# Patient Record
Sex: Male | Born: 1960
Health system: Southern US, Community
[De-identification: ages and names within clinical notes are randomized; demographics above are authoritative.]

## PROBLEM LIST (undated history)

## (undated) DIAGNOSIS — Z973 Presence of spectacles and contact lenses: Secondary | ICD-10-CM

## (undated) DIAGNOSIS — K429 Umbilical hernia without obstruction or gangrene: Secondary | ICD-10-CM

## (undated) DIAGNOSIS — K4020 Bilateral inguinal hernia, without obstruction or gangrene, not specified as recurrent: Secondary | ICD-10-CM

## (undated) DIAGNOSIS — E785 Hyperlipidemia, unspecified: Secondary | ICD-10-CM

## (undated) DIAGNOSIS — B351 Tinea unguium: Secondary | ICD-10-CM

## (undated) HISTORY — DX: Hyperlipidemia, unspecified: E78.5

## (undated) HISTORY — PX: WISDOM TOOTH EXTRACTION: SHX21

---
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
## 2016-08-27 HISTORY — PX: COLONOSCOPY: SHX5424

## 2016-09-21 LAB — HM COLONOSCOPY

## 2017-10-29 DIAGNOSIS — J209 Acute bronchitis, unspecified: Secondary | ICD-10-CM | POA: Diagnosis not present

## 2018-12-29 ENCOUNTER — Ambulatory Visit (INDEPENDENT_AMBULATORY_CARE_PROVIDER_SITE_OTHER): Payer: Federal, State, Local not specified - PPO | Admitting: Osteopathic Medicine

## 2018-12-29 ENCOUNTER — Encounter: Payer: Self-pay | Admitting: Osteopathic Medicine

## 2018-12-29 VITALS — BP 108/72 | HR 79 | Temp 98.2°F | Ht 68.0 in | Wt 174.7 lb

## 2018-12-29 DIAGNOSIS — Z0000 Encounter for general adult medical examination without abnormal findings: Secondary | ICD-10-CM | POA: Diagnosis not present

## 2018-12-29 DIAGNOSIS — E782 Mixed hyperlipidemia: Secondary | ICD-10-CM

## 2018-12-29 DIAGNOSIS — B351 Tinea unguium: Secondary | ICD-10-CM | POA: Diagnosis not present

## 2018-12-29 HISTORY — DX: Tinea unguium: B35.1

## 2018-12-29 HISTORY — DX: Mixed hyperlipidemia: E78.2

## 2018-12-29 MED ORDER — CICLOPIROX 8 % EX SOLN: Freq: Every day | CUTANEOUS | 11 refills | Status: DC

## 2018-12-29 NOTE — Progress Notes (Signed)
HPI: Benjamin Hall is a 58 y.o. male who  has a past medical history of High cholesterol, Mixed hyperlipidemia (12/29/2018), and Onychomycosis (12/29/2018).  he presents to [facility] today, 12/29/18,  for chief complaint of: New to establish care   Very pleasant new patient here to establish care.  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   History of high cholesterol, taking simvastatin for many years. Onychomycosis: Toenails are looking pretty bad.    Past medical, surgical, social and family history reviewed:  Patient Active Problem List   Diagnosis Date Noted  . Onychomycosis 12/29/2018  . Mixed hyperlipidemia 12/29/2018    Past Surgical History:  Procedure Laterality Date  . HERNIA REPAIR  1964    Social History   Tobacco Use  . Smoking status: Never Smoker  . Smokeless tobacco: Never Used  Substance Use Topics  . Alcohol use: Yes    Alcohol/week: 7.0 standard drinks    Types: 7 Standard drinks or equivalent per week    Family History  Problem Relation Age of Onset  . Lung cancer Mother   . Heart attack Father      Current medication list and allergy/intolerance information reviewed:    Current Outpatient Medications  Medication Sig Dispense Refill  . Aspirin Buf,CaCarb-MgCarb-MgO, 81 MG TABS Take by mouth.    . simvastatin (ZOCOR) 20 MG tablet Take 20 mg by mouth daily.    . ciclopirox (PENLAC) 8 % solution Apply topically at bedtime. Apply over nail and surrounding skin. Apply daily over previous coat. After seven (7) days, may remove with alcohol and continue cycle. 12 mL 11   No current facility-administered medications for this visit.     Allergies  Allergen Reactions  . Penicillins Nausea And Vomiting    Patient states that as a child penicillin gave him an upset stomach      Review of Systems:  Constitutional:  No  fever, no  chills, No recent illness, No unintentional weight changes. No significant fatigue.   HEENT: No  headache, no vision change, no hearing change, No sore throat, No  sinus pressure  Cardiac: No  chest pain, No  pressure, No palpitations, No  Orthopnea  Respiratory:  No  shortness of breath. No  Cough  Gastrointestinal: No  abdominal pain, No  nausea, No  vomiting,  No  blood in stool, No  diarrhea, No  constipation   Musculoskeletal: No new myalgia/arthralgia  Skin: No  Rash, No other wounds/concerning lesions  Genitourinary: No  incontinence, No  abnormal genital bleeding, No abnormal genital discharge  Hem/Onc: No  easy bruising/bleeding, No  abnormal lymph node  Endocrine: No cold intolerance,  No heat intolerance. No polyuria/polydipsia/polyphagia   Neurologic: No  weakness, No  dizziness, No  slurred speech/focal weakness/facial droop  Psychiatric: No  concerns with depression, No  concerns with anxiety, No sleep problems, No mood problems  Exam:  BP 108/72 (BP Location: Left Arm, Patient Position: Sitting, Cuff Size: Normal)   Pulse 79   Temp 98.2 F (36.8 C) (Oral)   Ht 5\' 8"  (1.727 m)   Wt 174 lb 11.2 oz (79.2 kg)   BMI 26.56 kg/m   Constitutional: VS see above. General Appearance: alert, well-developed, well-nourished, NAD  Eyes: Normal lids and conjunctive, non-icteric sclera  Ears, Nose, Mouth, Throat: MMM, Normal external inspection ears/nares/mouth/lips/gums. TM normal bilaterally. Pharynx/tonsils no erythema, no  exudate. Nasal mucosa normal.   Neck: No masses, trachea midline. No thyroid enlargement. No tenderness/mass appreciated. No lymphadenopathy  Respiratory: Normal respiratory effort. no wheeze, no rhonchi, no rales  Cardiovascular: S1/S2 normal, no murmur, no rub/gallop auscultated. RRR. No lower extremity edema  Gastrointestinal: Nontender, no masses. No hepatomegaly, no splenomegaly. No hernia appreciated. Bowel sounds normal. Rectal exam deferred.    Musculoskeletal: Gait normal. No clubbing/cyanosis of digits.   Neurological: Normal balance/coordination. No tremor.  Skin: warm, dry, intact. No rash/ulcer. No concerning nevi or subq nodules on limited exam.    Psychiatric: Normal judgment/insight. Normal mood and affect. Oriented x3.     ASSESSMENT/PLAN: The primary encounter diagnosis was Annual physical exam. Diagnoses of Mixed hyperlipidemia and Onychomycosis were also pertinent to this visit.   Orders Placed This Encounter  Procedures  . CBC  . COMPLETE METABOLIC PANEL WITH GFR  . Lipid panel  . TSH  . Hemoglobin A1c  . Urinalysis, Routine w reflex microscopic  . PSA, Total with Reflex to PSA, Free    Meds ordered this encounter  Medications  . ciclopirox (PENLAC) 8 % solution    Sig: Apply topically at bedtime. Apply over nail and surrounding skin. Apply daily over previous coat. After seven (7) days, may remove with alcohol and continue cycle.    Dispense:  12 mL    Refill:  11    Patient Instructions  General Preventive Care  Most recent routine screening lipids/other labs: ordered today   Everyone should have blood pressure checked once per year.   Tobacco: don't!   Alcohol: responsible moderation is ok for most adults - if you have concerns about your alcohol intake, please talk to me!   Exercise: as tolerated to reduce risk of cardiovascular disease and diabetes. Strength training will also prevent osteoporosis.   Mental health: if need for mental health care (medicines, counseling, other), or concerns about moods, please let me know!   Sexual health: if need for STD testing, or if concerns with libido/pain problems, please let me know!  Advanced Directive: Living Will and/or Healthcare Power of Attorney recommended for all adults, regardless of age or health.  Vaccines  Flu vaccine: recommended for almost everyone, every fall.   Shingles vaccine: Shingrix recommended after age 43.  Pneumonia  vaccines: Prevnar and Pneumovax recommended after age 71, or sooner if certain medical conditions.  Tetanus booster: Tdap recommended every 10 years.  Cancer screenings   Colon cancer screening: due in 10 years after your last one   Prostate cancer screening: PSA blood test around age 39  Lung cancer screening: not neded for non-smokers  Infection screenings . HIV: recommended screening at least once age 66-65, more often as needed. . Gonorrhea/Chlamydia: screening as needed. . Hepatitis C: recommended for anyone born [birth year]-1965 . TB: certain at-risk populations, or depending on work requirements and/or travel history Other . Bone Density Test: recommended for men at age 16, sooner depending on risk factors        Visit summary with medication list and pertinent instructions was printed for patient to review. All questions at time of visit were answered - patient instructed to contact office with any additional concerns or updates. ER/RTC precautions were reviewed with the patient.   Please note: voice recognition software was used to produce this document, and typos may escape review. Please contact Dr. Sheppard Coil for any needed clarifications.    Follow-up plan: Return in about 1 year (around 12/29/2019) for annual physical, sooner if needed /  dependign on lab results .

## 2018-12-29 NOTE — Patient Instructions (Addendum)
General Preventive Care  Most recent routine screening lipids/other labs: ordered today   Everyone should have blood pressure checked once per year.   Tobacco: don't!   Alcohol: responsible moderation is ok for most adults - if you have concerns about your alcohol intake, please talk to me!   Exercise: as tolerated to reduce risk of cardiovascular disease and diabetes. Strength training will also prevent osteoporosis.   Mental health: if need for mental health care (medicines, counseling, other), or concerns about moods, please let me know!   Sexual health: if need for STD testing, or if concerns with libido/pain problems, please let me know!  Advanced Directive: Living Will and/or Healthcare Power of Attorney recommended for all adults, regardless of age or health.  Vaccines  Flu vaccine: recommended for almost everyone, every fall.   Shingles vaccine: Shingrix recommended after age 39.  Pneumonia vaccines: Prevnar and Pneumovax recommended after age 28, or sooner if certain medical conditions.  Tetanus booster: Tdap recommended every 10 years.  Cancer screenings   Colon cancer screening: due in 10 years after your last one   Prostate cancer screening: PSA blood test around age 5  Lung cancer screening: not neded for non-smokers  Infection screenings . HIV: recommended screening at least once age 52-65, more often as needed. . Gonorrhea/Chlamydia: screening as needed. . Hepatitis C: recommended for anyone born 88-1964 . TB: certain at-risk populations, or depending on work requirements and/or travel history Other . Bone Density Test: recommended for men at age 36, sooner depending on risk factors

## 2019-03-08 ENCOUNTER — Telehealth: Payer: Self-pay

## 2019-03-08 ENCOUNTER — Telehealth: Payer: Self-pay | Admitting: Osteopathic Medicine

## 2019-03-08 MED ORDER — SIMVASTATIN 20 MG PO TABS: 20.0000 mg | ORAL_TABLET | Freq: Every day | ORAL | 3 refills | Status: DC

## 2019-03-08 NOTE — Telephone Encounter (Signed)
Pt stopped by the [facility] requesting med refill for simvastatin. Rx written by historical provider. Pls [facility] [address]. Thanks.

## 2019-03-08 NOTE — Telephone Encounter (Signed)
Patient stopped in after having bloodwork and stated that he needed a refill on simvastatin (20mg  tablet). [facility] [address] [facility]. His cell number on file is correct. Patient also inquired about getting his first shingles vaccine.

## 2019-03-08 NOTE — Telephone Encounter (Signed)
Thank you for the update. Rx was written by another provider. Separate telephone encounter for med refill request sent to provider for review.

## 2019-03-09 LAB — LIPID PANEL
Cholesterol: 191 mg/dL (ref <200)
HDL: 73 mg/dL (ref >=40)
LDL Cholesterol (Calc): 99 mg/dL (calc)
Non-HDL Cholesterol (Calc): 118 mg/dL (calc) (ref <130)
Total CHOL/HDL Ratio: 2.6 (calc) (ref <5.0)
Triglycerides: 93 mg/dL (ref <150)

## 2019-03-09 LAB — COMPLETE METABOLIC PANEL WITH GFR
AG Ratio: 1.5 (calc) (ref 1.0–2.5)
ALT: 22 U/L (ref 9–46)
AST: 21 U/L (ref 10–35)
Albumin: 4.3 g/dL (ref 3.6–5.1)
Alkaline phosphatase (APISO): 47 U/L (ref 35–144)
BUN: 15 mg/dL (ref 7–25)
CO2: 28 mmol/L (ref 20–32)
Calcium: 9.7 mg/dL (ref 8.6–10.3)
Chloride: 100 mmol/L (ref 98–110)
Creat: 0.96 mg/dL (ref 0.70–1.33)
GFR, Est African American: 101 mL/min/{1.73_m2} (ref >=60)
GFR, Est Non African American: 87 mL/min/{1.73_m2} (ref >=60)
Globulin: 2.9 g/dL (calc) (ref 1.9–3.7)
Glucose, Bld: 110 mg/dL — ABNORMAL HIGH (ref 65–99)
Potassium: 4.8 mmol/L (ref 3.5–5.3)
Sodium: 137 mmol/L (ref 135–146)
Total Bilirubin: 0.9 mg/dL (ref 0.2–1.2)
Total Protein: 7.2 g/dL (ref 6.1–8.1)

## 2019-03-09 LAB — CBC
HCT: 49 % (ref 38.5–50.0)
Hemoglobin: 16.6 g/dL (ref 13.2–17.1)
MCH: 30.9 pg (ref 27.0–33.0)
MCHC: 33.9 g/dL (ref 32.0–36.0)
MCV: 91.2 fL (ref 80.0–100.0)
MPV: 8.9 fL (ref 7.5–12.5)
Platelets: 213 10*3/uL (ref 140–400)
RBC: 5.37 10*6/uL (ref 4.20–5.80)
RDW: 13.1 % (ref 11.0–15.0)
WBC: 4.9 10*3/uL (ref 3.8–10.8)

## 2019-03-09 LAB — URINALYSIS, ROUTINE W REFLEX MICROSCOPIC
Bilirubin Urine: NEGATIVE
Glucose, UA: NEGATIVE
Hgb urine dipstick: NEGATIVE
Ketones, ur: NEGATIVE
Leukocytes,Ua: NEGATIVE
Nitrite: NEGATIVE
Protein, ur: NEGATIVE
Specific Gravity, Urine: 1.004 (ref 1.001–1.03)
pH: 7.5 (ref 5.0–8.0)

## 2019-03-09 LAB — HEMOGLOBIN A1C
Hgb A1c MFr Bld: 5.3 % of total Hgb (ref <5.7)
Mean Plasma Glucose: 105 (calc)
eAG (mmol/L): 5.8 (calc)

## 2019-03-09 LAB — TSH: TSH: 2.91 mIU/L (ref 0.40–4.50)

## 2019-03-10 LAB — REFLEX PSA, FREE
PSA, % Free: 9 % (calc) — ABNORMAL LOW (ref >25)
PSA, Free: 0.7 ng/mL

## 2019-03-10 LAB — PSA, TOTAL WITH REFLEX TO PSA, FREE: PSA, Total: 7.9 ng/mL — ABNORMAL HIGH (ref <=4.0)

## 2019-03-15 ENCOUNTER — Other Ambulatory Visit: Payer: Self-pay | Admitting: Osteopathic Medicine

## 2019-03-15 DIAGNOSIS — R972 Elevated prostate specific antigen [PSA]: Secondary | ICD-10-CM

## 2019-03-15 NOTE — Progress Notes (Signed)
Called patient Benjamin Hall 6:08 PM   Informed of elevated PSA test and next step would be a referral to urology given that it is over 7.0 He got blood drawn last Wednesday, I was out of the office from Thursday until today -have addressed with staff that leaving results on reviewed for this long is not acceptable.  Will ask a rush be put on this referral.  To triage:  Patient also had a question about insurance coverage for shingles shot, he states that he ask 1 of the ladies who worked at the front desk and they said that they would get back to him on this but he has not heard anything, I do not see any documentation of this.  I advised him that to be absolutely sure, he should probably call his insurance but if we can do this favor for him and will advise front desk staff to appropriately route such questions in the future.

## 2019-03-21 DIAGNOSIS — R972 Elevated prostate specific antigen [PSA]: Secondary | ICD-10-CM | POA: Diagnosis not present

## 2019-04-04 ENCOUNTER — Encounter: Payer: Self-pay | Admitting: Osteopathic Medicine

## 2019-04-04 DIAGNOSIS — R972 Elevated prostate specific antigen [PSA]: Secondary | ICD-10-CM | POA: Insufficient documentation

## 2019-04-05 DIAGNOSIS — R972 Elevated prostate specific antigen [PSA]: Secondary | ICD-10-CM | POA: Diagnosis not present

## 2019-04-13 DIAGNOSIS — R972 Elevated prostate specific antigen [PSA]: Secondary | ICD-10-CM | POA: Diagnosis not present

## 2019-04-13 DIAGNOSIS — K4090 Unilateral inguinal hernia, without obstruction or gangrene, not specified as recurrent: Secondary | ICD-10-CM | POA: Diagnosis not present

## 2019-05-01 ENCOUNTER — Ambulatory Visit: Payer: Self-pay | Admitting: Surgery

## 2019-05-01 DIAGNOSIS — K429 Umbilical hernia without obstruction or gangrene: Secondary | ICD-10-CM | POA: Diagnosis not present

## 2019-05-01 DIAGNOSIS — K4021 Bilateral inguinal hernia, without obstruction or gangrene, recurrent: Secondary | ICD-10-CM | POA: Diagnosis not present

## 2019-05-01 DIAGNOSIS — M6208 Separation of muscle (nontraumatic), other site: Secondary | ICD-10-CM | POA: Diagnosis not present

## 2019-05-01 NOTE — H&P (Signed)
Benjamin Hall Documented: 05/01/2019 9:22 AM Location: [facility] Patient #: [id number] DOB: 1961/01/24 Married / Language: Cleophus Molt / Race: White Male  History of Present Illness Benjamin Hector MD; 05/01/2019 10:09 AM) The patient is a 58 year old male who presents with an inguinal hernia. Note for "Inguinal hernia": ` ` ` Patient sent for surgical consultation at the request of Dr Karsten Ro, [facility]  Chief Complaint: Right inguinal hernia ` ` The patient is a pleasant gentleman. Was found to have an elevated PSA. Saw urology with workup and biopsies. Fortunately, no evidence of prostate cancer. Patient also noted he had a right inguinal hernia. Because prostate workup was benign, surgical consultation made to discuss possible surgical repair of his inguinal hernia. h/o pediatric inguinal hernia repairs as infant. Occasional RIH swelling/pain. Also other side. He comes today by himself. He is otherwise rather active. He does not smoke. He is not diabetic. He can walk several miles without difficulty and actually runs. Usually moves his bowels most days. He recalls having a normal colonoscopy a few years ago when he lived down in [place]. No problems with urination or defecation.  No personal nor family history of GI/colon cancer, inflammatory bowel disease, irritable bowel syndrome, allergy such as Celiac Sprue, dietary/dairy problems, colitis, ulcers nor gastritis. No recent sick contacts/gastroenteritis. No travel outside the country. No changes in diet. No dysphagia to solids or liquids. No significant heartburn or reflux. No hematochezia, hematemesis, coffee ground emesis. No evidence of prior gastric/peptic ulceration.  (Review of systems as stated in this history (HPI) or in the review of systems. Otherwise all other 12 point ROS are negative) ` ` `   Past Surgical History Nance Pew, CMA; 05/01/2019 9:23 AM) Oral  Surgery  Diagnostic Studies History ([place], CMA; 05/01/2019 9:23 AM) Colonoscopy 1-5 years ago  Allergies ([place], CMA; 05/01/2019 9:23 AM) Penicillins Allergies Reconciled  Medication History Nance Pew, CMA; 05/01/2019 9:24 AM) Simvastatin (20MG  Tablet, Oral) Active. Aspirin (81MG  Tablet, 1 (one) Oral) Active. Medications Reconciled  Social History Nance Pew, CMA; 05/01/2019 9:23 AM) Alcohol use Occasional alcohol use. Caffeine use Coffee. No drug use Tobacco use Never smoker.  Family History Nance Pew, [place]; 05/01/2019 9:23 AM) Arthritis Mother. Heart Disease Father, Mother. Respiratory Condition Mother.  Other Problems Nance Pew, CMA; 05/01/2019 9:23 AM) Hemorrhoids Hypercholesterolemia     Review of Systems ([place]; 05/01/2019 9:23 AM) General Not Present- Appetite Loss, Chills, Fatigue, Fever, Night Sweats, Weight Gain and Weight Loss. Skin Not Present- Change in Wart/Mole, Dryness, Hives, Jaundice, New Lesions, Non-Healing Wounds, Rash and Ulcer. HEENT Present- Wears glasses/contact lenses. Not Present- Earache, Hearing Loss, Hoarseness, Nose Bleed, Oral Ulcers, Ringing in the Ears, Seasonal Allergies, Sinus Pain, Sore Throat, Visual Disturbances and Yellow Eyes. Respiratory Present- Snoring. Not Present- Bloody sputum, Chronic Cough, Difficulty Breathing and Wheezing. Breast Not Present- Breast Mass, Breast Pain, Nipple Discharge and Skin Changes. Cardiovascular Not Present- Chest Pain, Difficulty Breathing Lying Down, Leg Cramps, Palpitations, Rapid Heart Rate, Shortness of Breath and Swelling of Extremities. Gastrointestinal Not Present- Abdominal Pain, Bloating, Bloody Stool, Change in Bowel Habits, Chronic diarrhea, Constipation, Difficulty Swallowing, Excessive gas, Gets full quickly at meals, Hemorrhoids, Indigestion, Nausea, Rectal Pain and Vomiting. Male Genitourinary Not Present- Blood in Urine, Change in  Urinary Stream, Frequency, Impotence, Nocturia, Painful Urination, Urgency and Urine Leakage. Musculoskeletal Not Present- Back Pain, Joint Pain, Joint Stiffness, Muscle Pain, Muscle Weakness and Swelling of Extremities. Neurological Not Present- Decreased Memory, Fainting,  Headaches, Numbness, Seizures, Tingling, Tremor, Trouble walking and Weakness. Psychiatric Not Present- Anxiety, Bipolar, Change in Sleep Pattern, Depression, Fearful and Frequent crying. Endocrine Not Present- Cold Intolerance, Excessive Hunger, Hair Changes, Heat Intolerance, Hot flashes and New Diabetes. Hematology Not Present- Blood Thinners, Easy Bruising, Excessive bleeding, Gland problems, HIV and Persistent Infections.  Vitals (Sabrina Canty CMA; 05/01/2019 9:25 AM) 05/01/2019 9:24 AM Weight: 183.2 lb Height: 68in Body Surface Area: 1.97 m Body Mass Index: 27.86 kg/m  Temp.: 52F(Temporal)  Pulse: 97 (Regular)  BP: 126/74 (Sitting, Left Arm, Standard)        Physical Exam Benjamin Hector MD; 05/01/2019 9:49 AM)  General Mental Status-Alert. General Appearance-Not in acute distress, Not Sickly. Orientation-Oriented X3. Hydration-Well hydrated. Voice-Normal.  Integumentary Global Assessment Upon inspection and palpation of skin surfaces of the - Axillae: non-tender, no inflammation or ulceration, no drainage. and Distribution of scalp and body hair is normal. General Characteristics Temperature - normal warmth is noted.  Head and Neck Head-normocephalic, atraumatic with no lesions or palpable masses. Face Global Assessment - atraumatic, no absence of expression. Neck Global Assessment - no abnormal movements, no bruit auscultated on the right, no bruit auscultated on the left, no decreased range of motion, non-tender. Trachea-midline. Thyroid Gland Characteristics - non-tender.  Eye Eyeball - Left-Extraocular movements intact, No Nystagmus - Left. Eyeball -  Right-Extraocular movements intact, No Nystagmus - Right. Cornea - Left-No Hazy - Left. Cornea - Right-No Hazy - Right. Sclera/Conjunctiva - Left-No scleral icterus, No Discharge - Left. Sclera/Conjunctiva - Right-No scleral icterus, No Discharge - Right. Pupil - Left-Direct reaction to light normal. Pupil - Right-Direct reaction to light normal.  ENMT Ears Pinna - Left - no drainage observed, no generalized tenderness observed. Pinna - Right - no drainage observed, no generalized tenderness observed. Nose and Sinuses External Inspection of the Nose - no destructive lesion observed. Inspection of the nares - Left - quiet respiration. Inspection of the nares - Right - quiet respiration. Mouth and Throat Lips - Upper Lip - no fissures observed, no pallor noted. Lower Lip - no fissures observed, no pallor noted. Nasopharynx - no discharge present. Oral Cavity/Oropharynx - Tongue - no dryness observed. Oral Mucosa - no cyanosis observed. Hypopharynx - no evidence of airway distress observed.  Chest and Lung Exam Inspection Movements - Normal and Symmetrical. Accessory muscles - No use of accessory muscles in breathing. Palpation Palpation of the chest reveals - Non-tender. Auscultation Breath sounds - Normal and Clear.  Cardiovascular Auscultation Rhythm - Regular. Murmurs & Other Heart Sounds - Auscultation of the heart reveals - No Murmurs and No Systolic Clicks.  Abdomen Inspection Inspection of the abdomen reveals - No Visible peristalsis and No Abnormal pulsations. Umbilicus - No Bleeding, No Urine drainage. Palpation/Percussion Palpation and Percussion of the abdomen reveal - Soft, Non Tender, No Rebound tenderness, No Rigidity (guarding) and No Cutaneous hyperesthesia. Note: Abdomen soft. Nontender. Not distended. Moderate diastases recti. Small but definite umbilical hernia. Reducible. No guarding.  Male Genitourinary Sexual Maturity Tanner 5 - Adult hair  pattern and Adult penile size and shape. Note: Normal external male genitalia. Obvious right groin asymmetry with small but definite inguinal hernia. Seems medial. Most likely direct. No definite hernia on the left side but very sensitive. Suspicious. No major inguinal lymphadenopathy. Mild testicular atrophy but no masses. Epididymides normal. Circumcised  Peripheral Vascular Upper Extremity Inspection - Left - No Cyanotic nailbeds - Left, Not Ischemic. Inspection - Right - No Cyanotic nailbeds - Right, Not Ischemic.  Neurologic Neurologic evaluation reveals -normal attention span and ability to concentrate, able to name objects and repeat phrases. Appropriate fund of knowledge , normal sensation and normal coordination. Mental Status Affect - not angry, not paranoid. Cranial Nerves-Normal Bilaterally. Gait-Normal.  Neuropsychiatric Mental status exam performed with findings of-able to articulate well with normal speech/language, rate, volume and coherence, thought content normal with ability to perform basic computations and apply abstract reasoning and no evidence of hallucinations, delusions, obsessions or homicidal/suicidal ideation.  Musculoskeletal Global Assessment Spine, Ribs and Pelvis - no instability, subluxation or laxity. Right Upper Extremity - no instability, subluxation or laxity.  Lymphatic Head & Neck  General Head & Neck Lymphatics: Bilateral - Description - No Localized lymphadenopathy. Axillary  General Axillary Region: Bilateral - Description - No Localized lymphadenopathy. Femoral & Inguinal  Generalized Femoral & Inguinal Lymphatics: Left - Description - No Localized lymphadenopathy. Right - Description - No Localized lymphadenopathy.    Assessment & Plan Benjamin Hector MD; 05/01/2019 10:09 AM)  BILATERAL RECURRENT INGUINAL HERNIA WITHOUT OBSTRUCTION OR GANGRENE (K40.21) Impression: Obvious right inguinal hernia. Seems medial direct. Sensitivity  left groin and history of intermittent bulging suspicious for contralateral left inguinal hernias well. History of pediatric inguinal hernia repairs when he was 58 years old  I think he would benefit from surgical repair. This is been going on for a while. Outpatient surgery with TEP approach. A long conversation about his concerns about surgery and mesh. He is taking care of a sickly elderly dog does not feel be able to be independent to lift him right away. He will think about things and let us know when he is ready for surgery   UMBILICAL HERNIA WITHOUT OBSTRUCTION AND WITHOUT GANGRENE (K42.9) Impression: Small but definite hernia at his belly button. I think it because of less than 2 cm, we can just to stitch repair at this point. He has risk of recurrence with his diastases and being overweight, but not severely high risk, so hold off on using any mesh there.   DIASTASIS RECTI (M62.08)  Current Plans Pt Education - [facility] Diastasis Recti: discussed with patient and provided information.  PREOP - ING HERNIA - ENCOUNTER FOR PREOPERATIVE EXAMINATION FOR GENERAL SURGICAL PROCEDURE (Z01.818)  Current Plans You are being scheduled for surgery- Our schedulers will call you.  You should hear from our office's scheduling department within 5 working days about the location, date, and time of surgery. We try to make accommodations for patient's preferences in scheduling surgery, but sometimes the OR schedule or the surgeon's schedule prevents [place] from making those accommodations.  If you have not heard from our office [phone]) in 5 working days, call the office and ask for your surgeon's nurse.  If you have other questions about your diagnosis, plan, or surgery, call the office and ask for your surgeon's nurse.  Written instructions provided The anatomy & physiology of the abdominal wall and pelvic floor was discussed. The pathophysiology of hernias in the inguinal and pelvic region was  discussed. Natural history risks such as progressive enlargement, pain, incarceration, and strangulation was discussed. Contributors to complications such as smoking, obesity, diabetes, prior surgery, etc were discussed.  I feel the risks of no intervention will lead to serious problems that outweigh the operative risks; therefore, I recommended surgery to reduce and repair the hernia. I explained laparoscopic techniques with possible need for an open approach. I noted usual use of mesh to patch and/or buttress hernia repair  Risks such as bleeding, infection,  abscess, need for further treatment, heart attack, death, and other risks were discussed. I noted a good likelihood this will help address the problem. Goals of post-operative recovery were discussed as well. Possibility that this will not correct all symptoms was explained. I stressed the importance of low-impact activity, aggressive pain control, avoiding constipation, & not pushing through pain to minimize risk of post-operative chronic pain or injury. Possibility of reherniation was discussed. We will work to minimize complications.  An educational handout further explaining the pathology & treatment options was given as well. Questions were answered. The patient expresses understanding & wishes to proceed with surgery.  Pt Education - Pamphlet Given - Laparoscopic Hernia Repair: discussed with patient and provided information. Pt Education - [facility] Pain Control (Lemuel Boodram) Pt Education - [facility] Hernia Post-Op HCI (Seraj Dunnam): discussed with patient and provided information. Pt Education - [facility] Mesh education: discussed with patient and provided information.  Benjamin Hector, MD, FACS, MASCRS Gastrointestinal and Minimally Invasive Surgery    [address], [place], [place] [address] [phone] Main / Paging [phone] Fax

## 2019-09-14 ENCOUNTER — Encounter (HOSPITAL_BASED_OUTPATIENT_CLINIC_OR_DEPARTMENT_OTHER): Payer: Self-pay | Admitting: Surgery

## 2019-09-14 ENCOUNTER — Other Ambulatory Visit: Payer: Self-pay

## 2019-09-14 NOTE — Progress Notes (Signed)
Spoke w/ via phone for pre-op interview--- PT Lab needs dos----  none             Lab results------ no COVID test ------ 09-18-2019 @ 1200 Arrive at ------- 0800 NPO after ------ [place] Medications to take morning of surgery ----- NONE Diabetic medication ----- n/a Patient Special Instructions ----- advised pt to call dr gross office for instructions when to stop ASA. Pre-Op special Istructions ----- n/a Patient verbalized understanding of instructions that were given at this phone interview. Patient denies shortness of breath, chest pain, fever, cough a this phone interview.

## 2019-09-15 ENCOUNTER — Ambulatory Visit: Payer: Self-pay | Admitting: Surgery

## 2019-09-15 NOTE — H&P (Signed)
Benjamin Hall  DOB: Jan 03, 1961  Married / Language: Cleophus Molt / Race: White  Male  History of Present Illness `  Patient sent for surgical consultation at the request of Dr Karsten Ro, [facility]  Chief Complaint: Inguinal hernias `  `  The patient is a pleasant gentleman. Was found to have an elevated PSA. Saw urology with workup and biopsies. Fortunately, no evidence of prostate cancer. Patient also noted he had a right inguinal hernia. Because prostate workup was benign, surgical consultation made to discuss possible surgical repair of his inguinal hernia. h/o pediatric inguinal hernia repairs as infant. Occasional RIH swelling/pain. Also other side. He comes today by himself. He is otherwise rather active. He does not smoke. He is not diabetic. He can walk several miles without difficulty and actually runs. Usually moves his bowels most days. He recalls having a normal colonoscopy a few years ago when he lived down in [place]. No problems with urination or defecation.  No personal nor family history of GI/colon cancer, inflammatory bowel disease, irritable bowel syndrome, allergy such as Celiac Sprue, dietary/dairy problems, colitis, ulcers nor gastritis. No recent sick contacts/gastroenteritis. No travel outside the country. No changes in diet. No dysphagia to solids or liquids. No significant heartburn or reflux. No hematochezia, hematemesis, coffee ground emesis. No evidence of prior gastric/peptic ulceration.  (Review of systems as stated in this history (HPI) or in the review of systems. Otherwise all other 12 point ROS are negative)  `  `  `  Past Surgical History Nance Pew, CMA; 05/01/2019 9:23 AM)  Oral Surgery  Diagnostic Studies History ([place], CMA; 05/01/2019 9:23 AM)  Colonoscopy 1-5 years ago  Allergies ([place], CMA; 05/01/2019 9:23 AM)  Penicillins  Allergies Reconciled  Medication History Nance Pew, CMA; 05/01/2019 9:24 AM)  Simvastatin  (20MG  Tablet, Oral) Active.  Aspirin (81MG  Tablet, 1 (one) Oral) Active.  Medications Reconciled  Social History Nance Pew, CMA; 05/01/2019 9:23 AM)  Alcohol use Occasional alcohol use.  Caffeine use Coffee.  No drug use  Tobacco use Never smoker.  Family History Nance Pew, [place]; 05/01/2019 9:23 AM)  Arthritis Mother.  Heart Disease Father, Mother.  Respiratory Condition Mother.  Other Problems Nance Pew, CMA; 05/01/2019 9:23 AM)  Hemorrhoids  Hypercholesterolemia  Review of Systems ([place]; 05/01/2019 9:23 AM)  General Not Present- Appetite Loss, Chills, Fatigue, Fever, Night Sweats, Weight Gain and Weight Loss.  Skin Not Present- Change in Wart/Mole, Dryness, Hives, Jaundice, New Lesions, Non-Healing Wounds, Rash and Ulcer.  HEENT Present- Wears glasses/contact lenses. Not Present- Earache, Hearing Loss, Hoarseness, Nose Bleed, Oral Ulcers, Ringing in the Ears, Seasonal Allergies, Sinus Pain, Sore Throat, Visual Disturbances and Yellow Eyes.  Respiratory Present- Snoring. Not Present- Bloody sputum, Chronic Cough, Difficulty Breathing and Wheezing.  Breast Not Present- Breast Mass, Breast Pain, Nipple Discharge and Skin Changes.  Cardiovascular Not Present- Chest Pain, Difficulty Breathing Lying Down, Leg Cramps, Palpitations, Rapid Heart Rate, Shortness of Breath and Swelling of Extremities.  Gastrointestinal Not Present- Abdominal Pain, Bloating, Bloody Stool, Change in Bowel Habits, Chronic diarrhea, Constipation, Difficulty Swallowing, Excessive gas, Gets full quickly at meals, Hemorrhoids, Indigestion, Nausea, Rectal Pain and Vomiting.  Male Genitourinary Not Present- Blood in Urine, Change in Urinary Stream, Frequency, Impotence, Nocturia, Painful Urination, Urgency and Urine Leakage.  Musculoskeletal Not Present- Back Pain, Joint Pain, Joint Stiffness, Muscle Pain, Muscle Weakness and Swelling of Extremities.  Neurological Not Present- Decreased Memory,  Fainting, Headaches, Numbness, Seizures, Tingling, Tremor, Trouble walking and  Weakness.  Psychiatric Not Present- Anxiety, Bipolar, Change in Sleep Pattern, Depression, Fearful and Frequent crying.  Endocrine Not Present- Cold Intolerance, Excessive Hunger, Hair Changes, Heat Intolerance, Hot flashes and New Diabetes.  Hematology Not Present- Blood Thinners, Easy Bruising, Excessive bleeding, Gland problems, HIV and Persistent Infections.  Vitals (Sabrina Canty CMA; 05/01/2019 9:25 AM)  05/01/2019 9:24 AM  Weight: 183.2 lb Height: 68 in  Body Surface Area: 1.97 m Body Mass Index: 27.86 kg/m  Temp.: 98 F (Temporal) Pulse: 97 (Regular)  BP: 126/74 (Sitting, Left Arm, Standard)  Physical Exam [name] MD; 05/01/2019 9:49 AM)  General  Mental Status - Alert.  General Appearance - Not in acute distress, Not Sickly.  Orientation - Oriented X3.  Hydration - Well hydrated.  Voice - Normal.  Integumentary  Global Assessment  Upon inspection and palpation of skin surfaces of the - Axillae: non-tender, no inflammation or ulceration, no drainage. and Distribution of scalp and body hair is normal.  General Characteristics  Temperature - normal warmth is noted.  Head and Neck  Head - normocephalic, atraumatic with no lesions or palpable masses.  Face  Global Assessment - atraumatic, no absence of expression.  Neck  Global Assessment - no abnormal movements, no bruit auscultated on the right, no bruit auscultated on the left, no decreased range of motion, non-tender.  Trachea - midline.  Thyroid  Gland Characteristics - non-tender.  Eye  Eyeball - Left - Extraocular movements intact, No Nystagmus - Left.  Eyeball - Right - Extraocular movements intact, No Nystagmus - Right.  Cornea - Left - No Hazy - Left.  Cornea - Right - No Hazy - Right.  Sclera/Conjunctiva - Left - No scleral icterus, No Discharge - Left.  Sclera/Conjunctiva - Right - No scleral icterus, No Discharge - Right.    Pupil - Left - Direct reaction to light normal.  Pupil - Right - Direct reaction to light normal.  ENMT  Ears  Pinna - Left - no drainage observed, no generalized tenderness observed. Pinna - Right - no drainage observed, no generalized tenderness observed.  Nose and Sinuses  External Inspection of the Nose - no destructive lesion observed. Inspection of the nares - Left - quiet respiration. Inspection of the nares - Right - quiet respiration.  Mouth and Throat  Lips - Upper Lip - no fissures observed, no pallor noted. Lower Lip - no fissures observed, no pallor noted. Nasopharynx - no discharge present. Oral Cavity/Oropharynx - Tongue - no dryness observed. Oral Mucosa - no cyanosis observed. Hypopharynx - no evidence of airway distress observed.  Chest and Lung Exam  Inspection  Movements - Normal and Symmetrical. Accessory muscles - No use of accessory muscles in breathing.  Palpation  Palpation of the chest reveals - Non-tender.  Auscultation  Breath sounds - Normal and Clear.  Cardiovascular  Auscultation  Rhythm - Regular. Murmurs & Other Heart Sounds - Auscultation of the heart reveals - No Murmurs and No Systolic Clicks.  Abdomen  Inspection  Inspection of the abdomen reveals - No Visible peristalsis and No Abnormal pulsations. Umbilicus - No Bleeding, No Urine drainage.  Palpation/Percussion  Palpation and Percussion of the abdomen reveal - Soft, Non Tender, No Rebound tenderness, No Rigidity (guarding) and No Cutaneous hyperesthesia.  Note: Abdomen soft. Nontender. Not distended. Moderate diastases recti. Small but definite umbilical hernia. Reducible. No guarding.  Male Genitourinary  Sexual Maturity  Tanner 5 - Adult hair pattern and Adult penile size and shape.  Note:  Normal external male genitalia. Obvious right groin asymmetry with small but definite inguinal hernia. Seems medial. Most likely direct. No definite hernia on the left side but very sensitive. Suspicious. No  major inguinal lymphadenopathy. Mild testicular atrophy but no masses. Epididymides normal. Circumcised  Peripheral Vascular  Upper Extremity  Inspection - Left - No Cyanotic nailbeds - Left, Not Ischemic. Inspection - Right - No Cyanotic nailbeds - Right, Not Ischemic.  Neurologic  Neurologic evaluation reveals - normal attention span and ability to concentrate, able to name objects and repeat phrases. Appropriate fund of knowledge , normal sensation and normal coordination.  Mental Status  Affect - not angry, not paranoid.  Cranial Nerves - Normal Bilaterally.  Gait - Normal.  Neuropsychiatric  Mental status exam performed with findings of - able to articulate well with normal speech/language, rate, volume and coherence, thought content normal with ability to perform basic computations and apply abstract reasoning and no evidence of hallucinations, delusions, obsessions or homicidal/suicidal ideation.  Musculoskeletal  Global Assessment  Spine, Ribs and Pelvis - no instability, subluxation or laxity. Right Upper Extremity - no instability, subluxation or laxity.  Lymphatic  Head & Neck  General Head & Neck Lymphatics: Bilateral - Description - No Localized lymphadenopathy.  Axillary  General Axillary Region: Bilateral - Description - No Localized lymphadenopathy.  Femoral & Inguinal  Generalized Femoral & Inguinal Lymphatics: Left - Description - No Localized lymphadenopathy. Right - Description - No Localized lymphadenopathy.   Assessment & Plan  BILATERAL RECURRENT INGUINAL HERNIA WITHOUT OBSTRUCTION OR GANGRENE (K40.21)  Impression: Obvious right inguinal hernia. Seems medial  / direct. Sensitivity left groin and history of intermittent bulging suspicious for contralateral left inguinal hernias well. History of pediatric inguinal hernia repairs when he was 59 years old  I think he would benefit from surgical repair. This is been going on for a while. Outpatient surgery with TEP  approach. A long conversation about his concerns about surgery and mesh. He is taking care of a sickly elderly dog does not feel be able to be independent to lift him right away. He will think about things and let us know when he is ready for surgery.   UMBILICAL HERNIA WITHOUT OBSTRUCTION AND WITHOUT GANGRENE (K42.9)  Impression: Small but definite hernia at his belly button. I think it because of less than 2 cm, we can just to stitch repair at this point. He has risk of recurrence with his diastases and being overweight, but not severely high risk, so hold off on using any mesh there.   DIASTASIS RECTI (M62.08)  Current Plans  Pt Education - CCS Diastasis Recti: discussed with patient and provided information.  PREOP - ING HERNIA - ENCOUNTER FOR PREOPERATIVE EXAMINATION FOR GENERAL SURGICAL PROCEDURE (Z01.818)  Current Plans  You are being scheduled for surgery - Our schedulers will call you.  You should hear from our office's scheduling department within 5 working days about the location, date, and time of surgery. We try to make accommodations for patient's preferences in scheduling surgery, but sometimes the OR schedule or the surgeon's schedule prevents [place] from making those accommodations.  If you have not heard from our office [phone]) in 5 working days, call the office and ask for your surgeon's nurse.  If you have other questions about your diagnosis, plan, or surgery, call the office and ask for your surgeon's nurse.  Written instructions provided  The anatomy & physiology of the abdominal wall and pelvic floor was discussed. The pathophysiology of hernias  in the inguinal and pelvic region was discussed. Natural history risks such as progressive enlargement, pain, incarceration, and strangulation was discussed. Contributors to complications such as smoking, obesity, diabetes, prior surgery, etc were discussed.  I feel the risks of no intervention will lead to serious problems that  outweigh the operative risks; therefore, I recommended surgery to reduce and repair the hernia. I explained laparoscopic techniques with possible need for an open approach. I noted usual use of mesh to patch and/or buttress hernia repair  Risks such as bleeding, infection, abscess, need for further treatment, heart attack, death, and other risks were discussed. I noted a good likelihood this will help address the problem. Goals of post-operative recovery were discussed as well. Possibility that this will not correct all symptoms was explained. I stressed the importance of low-impact activity, aggressive pain control, avoiding constipation, & not pushing through pain to minimize risk of post-operative chronic pain or injury. Possibility of reherniation was discussed. We will work to minimize complications.  An educational handout further explaining the pathology & treatment options was given as well. Questions were answered. The patient expresses understanding & wishes to proceed with surgery.    [address] [place]  [place],  [address]  [phone] Main / Paging  [phone] Fax

## 2019-09-15 NOTE — H&P (View-Only) (Signed)
Benjamin Hall  DOB: 06/26/1961  Married / Language: Cleophus Molt / Race: White  Male  History of Present Illness `  Patient sent for surgical consultation at the request of Dr Karsten Ro, [facility]  Chief Complaint: Inguinal hernias `  `  The patient is a pleasant gentleman. Was found to have an elevated PSA. Saw urology with workup and biopsies. Fortunately, no evidence of prostate cancer. Patient also noted he had a right inguinal hernia. Because prostate workup was benign, surgical consultation made to discuss possible surgical repair of his inguinal hernia. h/o pediatric inguinal hernia repairs as infant. Occasional RIH swelling/pain. Also other side. He comes today by himself. He is otherwise rather active. He does not smoke. He is not diabetic. He can walk several miles without difficulty and actually runs. Usually moves his bowels most days. He recalls having a normal colonoscopy a few years ago when he lived down in [place]. No problems with urination or defecation.  No personal nor family history of GI/colon cancer, inflammatory bowel disease, irritable bowel syndrome, allergy such as Celiac Sprue, dietary/dairy problems, colitis, ulcers nor gastritis. No recent sick contacts/gastroenteritis. No travel outside the country. No changes in diet. No dysphagia to solids or liquids. No significant heartburn or reflux. No hematochezia, hematemesis, coffee ground emesis. No evidence of prior gastric/peptic ulceration.  (Review of systems as stated in this history (HPI) or in the review of systems. Otherwise all other 12 point ROS are negative)  `  `  `  Past Surgical History Nance Pew, CMA; 05/01/2019 9:23 AM)  Oral Surgery  Diagnostic Studies History ([place], CMA; 05/01/2019 9:23 AM)  Colonoscopy 1-5 years ago  Allergies ([place], CMA; 05/01/2019 9:23 AM)  Penicillins  Allergies Reconciled  Medication History Nance Pew, CMA; 05/01/2019 9:24 AM)  Simvastatin  (20MG  Tablet, Oral) Active.  Aspirin (81MG  Tablet, 1 (one) Oral) Active.  Medications Reconciled  Social History Nance Pew, CMA; 05/01/2019 9:23 AM)  Alcohol use Occasional alcohol use.  Caffeine use Coffee.  No drug use  Tobacco use Never smoker.  Family History Nance Pew, [place]; 05/01/2019 9:23 AM)  Arthritis Mother.  Heart Disease Father, Mother.  Respiratory Condition Mother.  Other Problems Nance Pew, CMA; 05/01/2019 9:23 AM)  Hemorrhoids  Hypercholesterolemia  Review of Systems ([place]; 05/01/2019 9:23 AM)  General Not Present- Appetite Loss, Chills, Fatigue, Fever, Night Sweats, Weight Gain and Weight Loss.  Skin Not Present- Change in Wart/Mole, Dryness, Hives, Jaundice, New Lesions, Non-Healing Wounds, Rash and Ulcer.  HEENT Present- Wears glasses/contact lenses. Not Present- Earache, Hearing Loss, Hoarseness, Nose Bleed, Oral Ulcers, Ringing in the Ears, Seasonal Allergies, Sinus Pain, Sore Throat, Visual Disturbances and Yellow Eyes.  Respiratory Present- Snoring. Not Present- Bloody sputum, Chronic Cough, Difficulty Breathing and Wheezing.  Breast Not Present- Breast Mass, Breast Pain, Nipple Discharge and Skin Changes.  Cardiovascular Not Present- Chest Pain, Difficulty Breathing Lying Down, Leg Cramps, Palpitations, Rapid Heart Rate, Shortness of Breath and Swelling of Extremities.  Gastrointestinal Not Present- Abdominal Pain, Bloating, Bloody Stool, Change in Bowel Habits, Chronic diarrhea, Constipation, Difficulty Swallowing, Excessive gas, Gets full quickly at meals, Hemorrhoids, Indigestion, Nausea, Rectal Pain and Vomiting.  Male Genitourinary Not Present- Blood in Urine, Change in Urinary Stream, Frequency, Impotence, Nocturia, Painful Urination, Urgency and Urine Leakage.  Musculoskeletal Not Present- Back Pain, Joint Pain, Joint Stiffness, Muscle Pain, Muscle Weakness and Swelling of Extremities.  Neurological Not Present- Decreased Memory,  Fainting, Headaches, Numbness, Seizures, Tingling, Tremor, Trouble walking and  Weakness.  Psychiatric Not Present- Anxiety, Bipolar, Change in Sleep Pattern, Depression, Fearful and Frequent crying.  Endocrine Not Present- Cold Intolerance, Excessive Hunger, Hair Changes, Heat Intolerance, Hot flashes and New Diabetes.  Hematology Not Present- Blood Thinners, Easy Bruising, Excessive bleeding, Gland problems, HIV and Persistent Infections.  Vitals (Sabrina Canty CMA; 05/01/2019 9:25 AM)  05/01/2019 9:24 AM  Weight: 183.2 lb Height: 68 in  Body Surface Area: 1.97 m Body Mass Index: 27.86 kg/m  Temp.: 98 F (Temporal) Pulse: 97 (Regular)  BP: 126/74 (Sitting, Left Arm, Standard)  Physical Exam [name] MD; 05/01/2019 9:49 AM)  General  Mental Status - Alert.  General Appearance - Not in acute distress, Not Sickly.  Orientation - Oriented X3.  Hydration - Well hydrated.  Voice - Normal.  Integumentary  Global Assessment  Upon inspection and palpation of skin surfaces of the - Axillae: non-tender, no inflammation or ulceration, no drainage. and Distribution of scalp and body hair is normal.  General Characteristics  Temperature - normal warmth is noted.  Head and Neck  Head - normocephalic, atraumatic with no lesions or palpable masses.  Face  Global Assessment - atraumatic, no absence of expression.  Neck  Global Assessment - no abnormal movements, no bruit auscultated on the right, no bruit auscultated on the left, no decreased range of motion, non-tender.  Trachea - midline.  Thyroid  Gland Characteristics - non-tender.  Eye  Eyeball - Left - Extraocular movements intact, No Nystagmus - Left.  Eyeball - Right - Extraocular movements intact, No Nystagmus - Right.  Cornea - Left - No Hazy - Left.  Cornea - Right - No Hazy - Right.  Sclera/Conjunctiva - Left - No scleral icterus, No Discharge - Left.  Sclera/Conjunctiva - Right - No scleral icterus, No Discharge - Right.    Pupil - Left - Direct reaction to light normal.  Pupil - Right - Direct reaction to light normal.  ENMT  Ears  Pinna - Left - no drainage observed, no generalized tenderness observed. Pinna - Right - no drainage observed, no generalized tenderness observed.  Nose and Sinuses  External Inspection of the Nose - no destructive lesion observed. Inspection of the nares - Left - quiet respiration. Inspection of the nares - Right - quiet respiration.  Mouth and Throat  Lips - Upper Lip - no fissures observed, no pallor noted. Lower Lip - no fissures observed, no pallor noted. Nasopharynx - no discharge present. Oral Cavity/Oropharynx - Tongue - no dryness observed. Oral Mucosa - no cyanosis observed. Hypopharynx - no evidence of airway distress observed.  Chest and Lung Exam  Inspection  Movements - Normal and Symmetrical. Accessory muscles - No use of accessory muscles in breathing.  Palpation  Palpation of the chest reveals - Non-tender.  Auscultation  Breath sounds - Normal and Clear.  Cardiovascular  Auscultation  Rhythm - Regular. Murmurs & Other Heart Sounds - Auscultation of the heart reveals - No Murmurs and No Systolic Clicks.  Abdomen  Inspection  Inspection of the abdomen reveals - No Visible peristalsis and No Abnormal pulsations. Umbilicus - No Bleeding, No Urine drainage.  Palpation/Percussion  Palpation and Percussion of the abdomen reveal - Soft, Non Tender, No Rebound tenderness, No Rigidity (guarding) and No Cutaneous hyperesthesia.  Note: Abdomen soft. Nontender. Not distended. Moderate diastases recti. Small but definite umbilical hernia. Reducible. No guarding.  Male Genitourinary  Sexual Maturity  Tanner 5 - Adult hair pattern and Adult penile size and shape.  Note:  Normal external male genitalia. Obvious right groin asymmetry with small but definite inguinal hernia. Seems medial. Most likely direct. No definite hernia on the left side but very sensitive. Suspicious. No  major inguinal lymphadenopathy. Mild testicular atrophy but no masses. Epididymides normal. Circumcised  Peripheral Vascular  Upper Extremity  Inspection - Left - No Cyanotic nailbeds - Left, Not Ischemic. Inspection - Right - No Cyanotic nailbeds - Right, Not Ischemic.  Neurologic  Neurologic evaluation reveals - normal attention span and ability to concentrate, able to name objects and repeat phrases. Appropriate fund of knowledge , normal sensation and normal coordination.  Mental Status  Affect - not angry, not paranoid.  Cranial Nerves - Normal Bilaterally.  Gait - Normal.  Neuropsychiatric  Mental status exam performed with findings of - able to articulate well with normal speech/language, rate, volume and coherence, thought content normal with ability to perform basic computations and apply abstract reasoning and no evidence of hallucinations, delusions, obsessions or homicidal/suicidal ideation.  Musculoskeletal  Global Assessment  Spine, Ribs and Pelvis - no instability, subluxation or laxity. Right Upper Extremity - no instability, subluxation or laxity.  Lymphatic  Head & Neck  General Head & Neck Lymphatics: Bilateral - Description - No Localized lymphadenopathy.  Axillary  General Axillary Region: Bilateral - Description - No Localized lymphadenopathy.  Femoral & Inguinal  Generalized Femoral & Inguinal Lymphatics: Left - Description - No Localized lymphadenopathy. Right - Description - No Localized lymphadenopathy.   Assessment & Plan  BILATERAL RECURRENT INGUINAL HERNIA WITHOUT OBSTRUCTION OR GANGRENE (K40.21)  Impression: Obvious right inguinal hernia. Seems medial  / direct. Sensitivity left groin and history of intermittent bulging suspicious for contralateral left inguinal hernias well. History of pediatric inguinal hernia repairs when he was 59 years old  I think he would benefit from surgical repair. This is been going on for a while. Outpatient surgery with TEP  approach. A long conversation about his concerns about surgery and mesh. He is taking care of a sickly elderly dog does not feel be able to be independent to lift him right away. He will think about things and let us know when he is ready for surgery.   UMBILICAL HERNIA WITHOUT OBSTRUCTION AND WITHOUT GANGRENE (K42.9)  Impression: Small but definite hernia at his belly button. I think it because of less than 2 cm, we can just to stitch repair at this point. He has risk of recurrence with his diastases and being overweight, but not severely high risk, so hold off on using any mesh there.   DIASTASIS RECTI (M62.08)  Current Plans  Pt Education - [facility] Diastasis Recti: discussed with patient and provided information.  PREOP - ING HERNIA - ENCOUNTER FOR PREOPERATIVE EXAMINATION FOR GENERAL SURGICAL PROCEDURE (Z01.818)  Current Plans  You are being scheduled for surgery - Our schedulers will call you.  You should hear from our office's scheduling department within 5 working days about the location, date, and time of surgery. We try to make accommodations for patient's preferences in scheduling surgery, but sometimes the OR schedule or the surgeon's schedule prevents [place] from making those accommodations.  If you have not heard from our office [phone]) in 5 working days, call the office and ask for your surgeon's nurse.  If you have other questions about your diagnosis, plan, or surgery, call the office and ask for your surgeon's nurse.  Written instructions provided  The anatomy & physiology of the abdominal wall and pelvic floor was discussed. The pathophysiology of hernias  in the inguinal and pelvic region was discussed. Natural history risks such as progressive enlargement, pain, incarceration, and strangulation was discussed. Contributors to complications such as smoking, obesity, diabetes, prior surgery, etc were discussed.  I feel the risks of no intervention will lead to serious problems that  outweigh the operative risks; therefore, I recommended surgery to reduce and repair the hernia. I explained laparoscopic techniques with possible need for an open approach. I noted usual use of mesh to patch and/or buttress hernia repair  Risks such as bleeding, infection, abscess, need for further treatment, heart attack, death, and other risks were discussed. I noted a good likelihood this will help address the problem. Goals of post-operative recovery were discussed as well. Possibility that this will not correct all symptoms was explained. I stressed the importance of low-impact activity, aggressive pain control, avoiding constipation, & not pushing through pain to minimize risk of post-operative chronic pain or injury. Possibility of reherniation was discussed. We will work to minimize complications.  An educational handout further explaining the pathology & treatment options was given as well. Questions were answered. The patient expresses understanding & wishes to proceed with surgery.    [address] [place]  [place], [place] [address]  [phone] Main / Paging  [phone] Fax

## 2019-09-18 ENCOUNTER — Other Ambulatory Visit (HOSPITAL_COMMUNITY)
Admission: RE | Admit: 2019-09-18 | Discharge: 2019-09-18 | Disposition: A | Discharge status: Home / Self Care | Payer: Federal, State, Local not specified - PPO | Source: Ambulatory Visit | Attending: Surgery | Admitting: Surgery

## 2019-09-18 DIAGNOSIS — Z01812 Encounter for preprocedural laboratory examination: Secondary | ICD-10-CM | POA: Diagnosis not present

## 2019-09-18 DIAGNOSIS — Z20822 Contact with and (suspected) exposure to covid-19: Secondary | ICD-10-CM | POA: Diagnosis not present

## 2019-09-18 LAB — SARS CORONAVIRUS 2 (TAT 6-24 HRS): SARS Coronavirus 2: NEGATIVE

## 2019-09-20 MED ORDER — CLINDAMYCIN PHOSPHATE 900 MG/50ML IV SOLN
900.0000 mg | INTRAVENOUS | Status: DC
  Filled 2019-09-20: qty 50

## 2019-09-20 MED ORDER — GENTAMICIN SULFATE 40 MG/ML IJ SOLN
400.0000 mg | INTRAVENOUS | Status: AC
  Administered 2019-09-21: 450 mg via INTRAVENOUS
  Filled 2019-09-20 (×2): qty 10

## 2019-09-20 MED ORDER — GENTAMICIN SULFATE 40 MG/ML IJ SOLN
400.0000 mg | INTRAVENOUS | Status: DC
  Filled 2019-09-20: qty 10

## 2019-09-20 MED ORDER — CLINDAMYCIN PHOSPHATE 900 MG/50ML IV SOLN
900.0000 mg | INTRAVENOUS | Status: AC
  Administered 2019-09-21: 900 mg via INTRAVENOUS
  Filled 2019-09-20: qty 50

## 2019-09-21 ENCOUNTER — Ambulatory Visit (HOSPITAL_BASED_OUTPATIENT_CLINIC_OR_DEPARTMENT_OTHER): Payer: Federal, State, Local not specified - PPO | Admitting: Anesthesiology

## 2019-09-21 ENCOUNTER — Encounter (HOSPITAL_BASED_OUTPATIENT_CLINIC_OR_DEPARTMENT_OTHER): Payer: Self-pay | Admitting: Surgery

## 2019-09-21 ENCOUNTER — Other Ambulatory Visit: Payer: Self-pay

## 2019-09-21 ENCOUNTER — Ambulatory Visit (HOSPITAL_BASED_OUTPATIENT_CLINIC_OR_DEPARTMENT_OTHER)
Admission: RE | Admit: 2019-09-21 | Discharge: 2019-09-21 | Disposition: A | Discharge status: Home / Self Care | Payer: Federal, State, Local not specified - PPO | Attending: Surgery | Admitting: Surgery

## 2019-09-21 ENCOUNTER — Encounter (HOSPITAL_BASED_OUTPATIENT_CLINIC_OR_DEPARTMENT_OTHER)
Admission: RE | Disposition: A | Discharge status: Home / Self Care | Payer: Self-pay | Source: Home / Self Care | Attending: Surgery

## 2019-09-21 DIAGNOSIS — Z7982 Long term (current) use of aspirin: Secondary | ICD-10-CM | POA: Diagnosis not present

## 2019-09-21 DIAGNOSIS — E7800 Pure hypercholesterolemia, unspecified: Secondary | ICD-10-CM | POA: Diagnosis not present

## 2019-09-21 DIAGNOSIS — K660 Peritoneal adhesions (postprocedural) (postinfection): Secondary | ICD-10-CM | POA: Diagnosis not present

## 2019-09-21 DIAGNOSIS — K4021 Bilateral inguinal hernia, without obstruction or gangrene, recurrent: Secondary | ICD-10-CM | POA: Insufficient documentation

## 2019-09-21 DIAGNOSIS — Z79899 Other long term (current) drug therapy: Secondary | ICD-10-CM | POA: Insufficient documentation

## 2019-09-21 DIAGNOSIS — M6208 Separation of muscle (nontraumatic), other site: Secondary | ICD-10-CM | POA: Diagnosis not present

## 2019-09-21 DIAGNOSIS — Z880 Allergy status to penicillin: Secondary | ICD-10-CM | POA: Insufficient documentation

## 2019-09-21 DIAGNOSIS — K429 Umbilical hernia without obstruction or gangrene: Secondary | ICD-10-CM | POA: Diagnosis not present

## 2019-09-21 DIAGNOSIS — B351 Tinea unguium: Secondary | ICD-10-CM | POA: Diagnosis not present

## 2019-09-21 DIAGNOSIS — E782 Mixed hyperlipidemia: Secondary | ICD-10-CM | POA: Diagnosis not present

## 2019-09-21 HISTORY — DX: Bilateral inguinal hernia, without obstruction or gangrene, not specified as recurrent: K40.20

## 2019-09-21 HISTORY — PX: UMBILICAL HERNIA REPAIR: SHX196

## 2019-09-21 HISTORY — DX: Presence of spectacles and contact lenses: Z97.3

## 2019-09-21 HISTORY — DX: Umbilical hernia without obstruction or gangrene: K42.9

## 2019-09-21 HISTORY — PX: INGUINAL HERNIA REPAIR: SHX194

## 2019-09-21 SURGERY — REPAIR, HERNIA, INGUINAL, BILATERAL, LAPAROSCOPIC
Anesthesia: General

## 2019-09-21 MED ORDER — ONDANSETRON HCL 4 MG/2ML IJ SOLN
INTRAMUSCULAR | Status: DC | PRN
  Administered 2019-09-21: 4 mg via INTRAVENOUS

## 2019-09-21 MED ORDER — MEPERIDINE HCL 25 MG/ML IJ SOLN
6.2500 mg | INTRAMUSCULAR | Status: DC | PRN
  Filled 2019-09-21: qty 1

## 2019-09-21 MED ORDER — DEXAMETHASONE SODIUM PHOSPHATE 10 MG/ML IJ SOLN
INTRAMUSCULAR | Status: DC | PRN
  Administered 2019-09-21: 10 mg via INTRAVENOUS

## 2019-09-21 MED ORDER — LACTATED RINGERS IV SOLN
INTRAVENOUS | Status: DC
  Filled 2019-09-21: qty 1000

## 2019-09-21 MED ORDER — KETOROLAC TROMETHAMINE 30 MG/ML IJ SOLN
30.0000 mg | Freq: Once | INTRAMUSCULAR | Status: DC | PRN
  Filled 2019-09-21: qty 1

## 2019-09-21 MED ORDER — OXYCODONE HCL 5 MG PO TABS
5.0000 mg | ORAL_TABLET | Freq: Once | ORAL | Status: DC | PRN
  Filled 2019-09-21: qty 1

## 2019-09-21 MED ORDER — ACETAMINOPHEN 160 MG/5ML PO SOLN
325.0000 mg | ORAL | Status: DC | PRN
  Filled 2019-09-21: qty 20.3

## 2019-09-21 MED ORDER — FENTANYL CITRATE (PF) 250 MCG/5ML IJ SOLN
INTRAMUSCULAR | Status: DC | PRN
  Administered 2019-09-21 (×3): 50 ug via INTRAVENOUS
  Administered 2019-09-21: 100 ug via INTRAVENOUS

## 2019-09-21 MED ORDER — EPHEDRINE 5 MG/ML INJ
INTRAVENOUS | Status: AC
  Filled 2019-09-21: qty 10

## 2019-09-21 MED ORDER — EPHEDRINE SULFATE-NACL 50-0.9 MG/10ML-% IV SOSY
PREFILLED_SYRINGE | INTRAVENOUS | Status: DC | PRN
  Administered 2019-09-21: 10 mg via INTRAVENOUS

## 2019-09-21 MED ORDER — ACETAMINOPHEN 325 MG PO TABS
325.0000 mg | ORAL_TABLET | ORAL | Status: DC | PRN
  Filled 2019-09-21: qty 2

## 2019-09-21 MED ORDER — CELECOXIB 200 MG PO CAPS
ORAL_CAPSULE | ORAL | Status: AC
  Filled 2019-09-21: qty 2

## 2019-09-21 MED ORDER — FENTANYL CITRATE (PF) 250 MCG/5ML IJ SOLN
INTRAMUSCULAR | Status: AC
  Filled 2019-09-21: qty 5

## 2019-09-21 MED ORDER — GABAPENTIN 300 MG PO CAPS
300.0000 mg | ORAL_CAPSULE | ORAL | Status: AC
  Administered 2019-09-21: 300 mg via ORAL
  Filled 2019-09-21: qty 1

## 2019-09-21 MED ORDER — ROCURONIUM BROMIDE 10 MG/ML (PF) SYRINGE
PREFILLED_SYRINGE | INTRAVENOUS | Status: DC | PRN
  Administered 2019-09-21: 60 mg via INTRAVENOUS
  Administered 2019-09-21: 20 mg via INTRAVENOUS

## 2019-09-21 MED ORDER — PROPOFOL 10 MG/ML IV BOLUS
INTRAVENOUS | Status: DC | PRN
  Administered 2019-09-21: 200 mg via INTRAVENOUS

## 2019-09-21 MED ORDER — SUGAMMADEX SODIUM 200 MG/2ML IV SOLN
INTRAVENOUS | Status: DC | PRN
  Administered 2019-09-21: 200 mg via INTRAVENOUS

## 2019-09-21 MED ORDER — ACETAMINOPHEN 500 MG PO TABS
ORAL_TABLET | ORAL | Status: AC
  Filled 2019-09-21: qty 2

## 2019-09-21 MED ORDER — FENTANYL CITRATE (PF) 100 MCG/2ML IJ SOLN
25.0000 ug | INTRAMUSCULAR | Status: DC | PRN
  Filled 2019-09-21: qty 1

## 2019-09-21 MED ORDER — KETOROLAC TROMETHAMINE 30 MG/ML IJ SOLN
INTRAMUSCULAR | Status: AC
  Filled 2019-09-21: qty 1

## 2019-09-21 MED ORDER — BUPIVACAINE-EPINEPHRINE 0.25% -1:200000 IJ SOLN
INTRAMUSCULAR | Status: DC | PRN
  Administered 2019-09-21: 60 mL

## 2019-09-21 MED ORDER — OXYCODONE HCL 5 MG/5ML PO SOLN
5.0000 mg | Freq: Once | ORAL | Status: DC | PRN
  Filled 2019-09-21: qty 5

## 2019-09-21 MED ORDER — ROCURONIUM BROMIDE 10 MG/ML (PF) SYRINGE
PREFILLED_SYRINGE | INTRAVENOUS | Status: AC
  Filled 2019-09-21: qty 10

## 2019-09-21 MED ORDER — ONDANSETRON HCL 4 MG/2ML IJ SOLN
4.0000 mg | Freq: Once | INTRAMUSCULAR | Status: DC | PRN
  Filled 2019-09-21: qty 2

## 2019-09-21 MED ORDER — ACETAMINOPHEN 500 MG PO TABS
1000.0000 mg | ORAL_TABLET | ORAL | Status: AC
  Administered 2019-09-21: 1000 mg via ORAL
  Filled 2019-09-21: qty 2

## 2019-09-21 MED ORDER — MIDAZOLAM HCL 2 MG/2ML IJ SOLN
INTRAMUSCULAR | Status: AC
  Filled 2019-09-21: qty 2

## 2019-09-21 MED ORDER — CELECOXIB 400 MG PO CAPS
400.0000 mg | ORAL_CAPSULE | ORAL | Status: AC
  Administered 2019-09-21: 400 mg via ORAL
  Filled 2019-09-21: qty 1

## 2019-09-21 MED ORDER — PROPOFOL 10 MG/ML IV BOLUS
INTRAVENOUS | Status: AC
  Filled 2019-09-21: qty 20

## 2019-09-21 MED ORDER — MIDAZOLAM HCL 2 MG/2ML IJ SOLN
INTRAMUSCULAR | Status: DC | PRN
  Administered 2019-09-21: 2 mg via INTRAVENOUS

## 2019-09-21 MED ORDER — LIDOCAINE 2% (20 MG/ML) 5 ML SYRINGE
INTRAMUSCULAR | Status: DC | PRN
  Administered 2019-09-21: 75 mg via INTRAVENOUS

## 2019-09-21 MED ORDER — CLINDAMYCIN PHOSPHATE 900 MG/50ML IV SOLN
INTRAVENOUS | Status: AC
  Filled 2019-09-21: qty 50

## 2019-09-21 MED ORDER — CHLORHEXIDINE GLUCONATE CLOTH 2 % EX PADS
6.0000 | MEDICATED_PAD | Freq: Once | CUTANEOUS | Status: DC
  Filled 2019-09-21: qty 6

## 2019-09-21 MED ORDER — DEXAMETHASONE SODIUM PHOSPHATE 10 MG/ML IJ SOLN
INTRAMUSCULAR | Status: AC
  Filled 2019-09-21: qty 1

## 2019-09-21 MED ORDER — KETAMINE HCL 10 MG/ML IJ SOLN
INTRAMUSCULAR | Status: DC | PRN
  Administered 2019-09-21: 35 mg via INTRAVENOUS

## 2019-09-21 MED ORDER — BUPIVACAINE LIPOSOME 1.3 % IJ SUSP
20.0000 mL | Freq: Once | INTRAMUSCULAR | Status: DC
  Filled 2019-09-21: qty 20

## 2019-09-21 MED ORDER — LIDOCAINE 2% (20 MG/ML) 5 ML SYRINGE
INTRAMUSCULAR | Status: AC
  Filled 2019-09-21: qty 15

## 2019-09-21 MED ORDER — GABAPENTIN 300 MG PO CAPS
ORAL_CAPSULE | ORAL | Status: AC
  Filled 2019-09-21: qty 1

## 2019-09-21 MED ORDER — BUPIVACAINE LIPOSOME 1.3 % IJ SUSP
INTRAMUSCULAR | Status: DC | PRN
  Administered 2019-09-21: 20 mL

## 2019-09-21 MED ORDER — ONDANSETRON HCL 4 MG/2ML IJ SOLN
INTRAMUSCULAR | Status: AC
  Filled 2019-09-21: qty 2

## 2019-09-21 MED ORDER — TRAMADOL HCL 50 MG PO TABS: 50.0000 mg | ORAL_TABLET | Freq: Four times a day (QID) | ORAL | 0 refills | Status: DC | PRN

## 2019-09-21 MED ORDER — KETAMINE HCL 10 MG/ML IJ SOLN
INTRAMUSCULAR | Status: AC
  Filled 2019-09-21: qty 1

## 2019-09-21 SURGICAL SUPPLY — 70 items
BENZOIN TINCTURE PRP APPL 2/3 (GAUZE/BANDAGES/DRESSINGS) ×3 IMPLANT
BLADE CLIPPER SENSICLIP SURGIC (BLADE) ×3 IMPLANT
BLADE HEX COATED 2.75 (ELECTRODE) ×3 IMPLANT
BLADE SURG 15 STRL LF DISP TIS (BLADE) ×2 IMPLANT
BLADE SURG 15 STRL SS (BLADE) ×1
CABLE HIGH FREQUENCY MONO STRZ (ELECTRODE) ×3 IMPLANT
CANISTER SUCT 1200ML W/VALVE (MISCELLANEOUS) IMPLANT
CANISTER SUCT 3000ML PPV (MISCELLANEOUS) IMPLANT
CHLORAPREP W/TINT 26 (MISCELLANEOUS) ×3 IMPLANT
CLOTH BEACON ORANGE TIMEOUT ST (SAFETY) ×3 IMPLANT
COVER BACK TABLE 60X90IN (DRAPES) ×3 IMPLANT
COVER MAYO STAND STRL (DRAPES) ×3 IMPLANT
COVER WAND RF STERILE (DRAPES) ×6 IMPLANT
DECANTER SPIKE VIAL GLASS SM (MISCELLANEOUS) ×3 IMPLANT
DERMABOND ADVANCED (GAUZE/BANDAGES/DRESSINGS)
DERMABOND ADVANCED .7 DNX12 (GAUZE/BANDAGES/DRESSINGS) IMPLANT
DEVICE SECURE STRAP 25 ABSORB (INSTRUMENTS) IMPLANT
DRAPE LAPAROTOMY 100X72 PEDS (DRAPES) ×3 IMPLANT
DRAPE WARM FLUID 44X44 (DRAPES) ×3 IMPLANT
DRSG TEGADERM 2-3/8X2-3/4 SM (GAUZE/BANDAGES/DRESSINGS) ×3 IMPLANT
DRSG TEGADERM 4X4.75 (GAUZE/BANDAGES/DRESSINGS) ×3 IMPLANT
ELECT REM PT RETURN 9FT ADLT (ELECTROSURGICAL) ×3
ELECTRODE REM PT RTRN 9FT ADLT (ELECTROSURGICAL) ×2 IMPLANT
GAUZE SPONGE 4X4 12PLY STRL (GAUZE/BANDAGES/DRESSINGS) ×6 IMPLANT
GLOVE BIOGEL PI IND STRL 8 (GLOVE) ×2 IMPLANT
GLOVE BIOGEL PI INDICATOR 8 (GLOVE) ×1
GLOVE ECLIPSE 8.0 STRL XLNG CF (GLOVE) ×3 IMPLANT
GLOVE INDICATOR 8.0 STRL GRN (GLOVE) ×3 IMPLANT
GOWN STRL REUS W/ TWL LRG LVL3 (GOWN DISPOSABLE) ×2 IMPLANT
GOWN STRL REUS W/TWL LRG LVL3 (GOWN DISPOSABLE) ×1
GOWN STRL REUS W/TWL XL LVL3 (GOWN DISPOSABLE) ×3 IMPLANT
IRRIG SUCT STRYKERFLOW 2 WTIP (MISCELLANEOUS) ×3
IRRIGATION SUCT STRKRFLW 2 WTP (MISCELLANEOUS) ×2 IMPLANT
KIT TURNOVER CYSTO (KITS) ×3 IMPLANT
MANIFOLD NEPTUNE II (INSTRUMENTS) ×3 IMPLANT
MESH ULTRAPRO 6X6 15CM15CM (Mesh General) ×9 IMPLANT
NEEDLE HYPO 22GX1.5 SAFETY (NEEDLE) ×3 IMPLANT
NEEDLE HYPO 25X1 1.5 SAFETY (NEEDLE) ×3 IMPLANT
NS IRRIG 500ML POUR BTL (IV SOLUTION) ×3 IMPLANT
PACK BASIN DAY SURGERY FS (CUSTOM PROCEDURE TRAY) ×3 IMPLANT
PAD POSITIONING PINK XL (MISCELLANEOUS) ×3 IMPLANT
PENCIL BUTTON HOLSTER BLD 10FT (ELECTRODE) ×3 IMPLANT
SCISSORS LAP 5X35 DISP (ENDOMECHANICALS) ×3 IMPLANT
SET TUBE SMOKE EVAC HIGH FLOW (TUBING) ×3 IMPLANT
SLEEVE ADV FIXATION 5X100MM (TROCAR) ×3 IMPLANT
SLEEVE SCD COMPRESS KNEE MED (MISCELLANEOUS) ×3 IMPLANT
SPONGE GAUZE 2X2 8PLY STRL LF (GAUZE/BANDAGES/DRESSINGS) ×3 IMPLANT
SPONGE LAP 18X18 RF (DISPOSABLE) ×3 IMPLANT
STRIP CLOSURE SKIN 1/2X4 (GAUZE/BANDAGES/DRESSINGS) ×3 IMPLANT
SUT MNCRL AB 4-0 PS2 18 (SUTURE) ×3 IMPLANT
SUT MON AB 4-0 PC3 18 (SUTURE) ×3 IMPLANT
SUT PDS AB 1 CT1 27 (SUTURE) ×12 IMPLANT
SUT PROLENE 0 CT 1 30 (SUTURE) ×6 IMPLANT
SUT SILK 3 0 SH 30 (SUTURE) IMPLANT
SUT VIC AB 2-0 SH 27 (SUTURE)
SUT VIC AB 2-0 SH 27XBRD (SUTURE) IMPLANT
SUT VIC AB 3-0 54X BRD REEL (SUTURE) IMPLANT
SUT VIC AB 3-0 BRD 54 (SUTURE)
SUT VIC AB 3-0 SH 27 (SUTURE) ×1
SUT VIC AB 3-0 SH 27X BRD (SUTURE) ×2 IMPLANT
SUT VICRYL 0 UR6 27IN ABS (SUTURE) ×3 IMPLANT
SYR CONTROL 10ML LL (SYRINGE) ×3 IMPLANT
TOWEL OR 17X26 10 PK STRL BLUE (TOWEL DISPOSABLE) ×6 IMPLANT
TRAY DSU PREP LF (CUSTOM PROCEDURE TRAY) ×3 IMPLANT
TRAY LAPAROSCOPIC (CUSTOM PROCEDURE TRAY) ×3 IMPLANT
TROCAR ADV FIXATION 5X100MM (TROCAR) ×3 IMPLANT
TROCAR XCEL BLUNT TIP 100MML (ENDOMECHANICALS) ×3 IMPLANT
TUBE CONNECTING 12X1/4 (SUCTIONS) IMPLANT
WATER STERILE IRR 500ML POUR (IV SOLUTION) ×3 IMPLANT
YANKAUER SUCT BULB TIP NO VENT (SUCTIONS) IMPLANT

## 2019-09-21 NOTE — Anesthesia Preprocedure Evaluation (Signed)
Anesthesia Evaluation  Patient identified by MRN, date of birth, ID band Patient awake    Reviewed: Allergy & Precautions, NPO status , Patient's Chart, lab work & pertinent test results  Airway Mallampati: I       Dental no notable dental hx. (+) Teeth Intact   Pulmonary neg pulmonary ROS,    Pulmonary exam normal breath sounds clear to auscultation       Cardiovascular negative cardio ROS Normal cardiovascular exam Rhythm:Regular Rate:Normal     Neuro/Psych negative neurological ROS  negative psych ROS   GI/Hepatic negative GI ROS, Neg liver ROS,   Endo/Other  negative endocrine ROS  Renal/GU negative Renal ROS  negative genitourinary   Musculoskeletal negative musculoskeletal ROS (+)   Abdominal Normal abdominal exam  (+)   Peds negative pediatric ROS (+)  Hematology negative hematology ROS (+)   Anesthesia Other Findings   Reproductive/Obstetrics negative OB ROS                             Anesthesia Physical Anesthesia Plan  ASA: I  Anesthesia Plan: General   Post-op Pain Management:    Induction: Intravenous  PONV Risk Score and Plan: 4 or greater and Ondansetron, Dexamethasone and Midazolam  Airway Management Planned: Oral ETT  Additional Equipment: None  Intra-op Plan:   Post-operative Plan: Extubation in OR  Informed Consent: I have reviewed the patients History and Physical, chart, labs and discussed the procedure including the risks, benefits and alternatives for the proposed anesthesia with the patient or authorized representative who has indicated his/her understanding and acceptance.     Dental advisory given  Plan Discussed with: CRNA  Anesthesia Plan Comments:         Anesthesia Quick Evaluation

## 2019-09-21 NOTE — Interval H&P Note (Signed)
History and Physical Interval Note:  09/21/2019 9:42 AM  Benjamin Hall  has presented today for surgery, with the diagnosis of bilateral recurrent inguinal neew hernias, umbilical hernia.  The various methods of treatment have been discussed with the patient and family. After consideration of risks, benefits and other options for treatment, the patient has consented to  Procedure(s): [place] (Bilateral) PRIMARY UMBILICAL HERNIA REPAIR (N/A) as a surgical intervention.  The patient's history has been reviewed, patient examined, no change in status, stable for surgery.  I have reviewed the patient's chart and labs.  Questions were answered to the patient's satisfaction.    I have re-reviewed the the patient's records, history, medications, and allergies.  I have re-examined the patient.  I again discussed intraoperative plans and goals of post-operative recovery.  The patient agrees to proceed.  Benjamin Hall  [birth year] [id number]  Patient Care Team: Emeterio Reeve, DO as PCP - General (Osteopathic Medicine) Michael Boston, MD as Consulting Physician (General Surgery) Kathie Rhodes, MD as Consulting Physician (Urology)  Patient Active Problem List   Diagnosis Date Noted   Elevated PSA 04/04/2019   Onychomycosis 12/29/2018   Mixed hyperlipidemia 12/29/2018    Past Medical History:  Diagnosis Date   Inguinal hernia, bilateral    Mixed hyperlipidemia 12/29/2018   Onychomycosis [id number]   Umbilical hernia    Wears glasses     Past Surgical History:  Procedure Laterality Date   COLONOSCOPY  08/2016   HERNIA REPAIR  1964   per pt unsure where located   [place] History   Socioeconomic History   Marital status: Married    Spouse name: Not on file   Number of children: Not on file   Years of education: Not on file   Highest education level: Not on file  Occupational History   Not on file  Tobacco Use   Smoking  status: Never Smoker   Smokeless tobacco: Never Used  Substance and Sexual Activity   Alcohol use: Yes    Alcohol/week: 7.0 standard drinks    Types: 7 Standard drinks or equivalent per week    Comment: one daily   Drug use: Never   Sexual activity: Not on file  Other Topics Concern   Not on file  Social History Narrative   Not on file   Social Determinants of Health   Financial Resource Strain:    Difficulty of Paying Living Expenses: Not on file  Food Insecurity:    Worried About [place] in the Last Year: Not on file   Ran Out of Food in the Last Year: Not on file  Transportation Needs:    Lack of Transportation (Medical): Not on file   Lack of Transportation (Non-Medical): Not on file  Physical Activity:    Days of Exercise per Week: Not on file   Minutes of Exercise per Session: Not on file  Stress:    Feeling of Stress : Not on file  Social Connections:    Frequency of Communication with Friends and Family: Not on file   Frequency of Social Gatherings with Friends and Family: Not on file   Attends Religious Services: Not on file   Active Member of Clubs or Organizations: Not on file   Attends Archivist Meetings: Not on file   Marital Status: Not on file  Intimate Partner Violence:    Fear of Current or Ex-Partner: Not on file  Emotionally Abused: Not on file   Physically Abused: Not on file   Sexually Abused: Not on file    Family History  Problem Relation Age of Onset   Lung cancer Mother    Heart attack Father     Medications Prior to Admission  Medication Sig Dispense Refill Last Dose   aspirin EC 81 MG tablet Take 81 mg by mouth daily.   Past Week at Unknown time   simvastatin (ZOCOR) 20 MG tablet Take 1 tablet (20 mg total) by mouth daily. (Patient taking differently: Take 20 mg by mouth daily. ) 90 tablet 3 09/20/2019 at Unknown time    Current Facility-Administered Medications  Medication Dose Route Frequency Provider Last  Rate Last Admin   bupivacaine liposome (EXPAREL) 1.3 % injection 266 mg  20 mL Infiltration Once Michael Boston, MD       Chlorhexidine Gluconate Cloth 2 % PADS 6 each  6 each Topical Once Michael Boston, MD       And   Chlorhexidine Gluconate Cloth 2 % PADS 6 each  6 each Topical Once Michael Boston, MD       clindamycin (CLEOCIN) IVPB 900 mg  900 mg Intravenous On Call to OR [place], Cindie Laroche, RPH       And   gentamicin (GARAMYCIN) 400 mg in dextrose 5 % 100 mL IVPB  400 mg Intravenous On Call to [place], Cindie Laroche, RPH       lactated ringers infusion   Intravenous Continuous Audry Pili, MD 50 mL/hr at 09/21/19 0824 New Bag at 09/21/19 0824     Allergies  Allergen Reactions   Penicillins Nausea And Vomiting    Patient states that as a child penicillin gave him an upset stomach    BP 134/81   Pulse 75   Temp 98 F (36.7 C) (Oral)   Resp 16   Ht 5\' 8"  (1.727 m)   Wt 83.9 kg   SpO2 100%   BMI 28.13 kg/m   Labs: No results found for this or any previous visit (from the past 48 hour(s)).  Imaging / Studies: No results found.   [name], M.D., F.A.C.S. Gastrointestinal and Minimally Invasive Surgery [facility] [address], [place],  [address] [phone] Main / Paging  09/21/2019 9:42 AM    [name]

## 2019-09-21 NOTE — Discharge Instructions (Signed)
Information for Discharge Teaching: EXPAREL (bupivacaine liposome injectable suspension)   Your surgeon gave you EXPAREL(bupivacaine) in your surgical incision to help control your pain after surgery.   EXPAREL is a local anesthetic that provides pain relief by numbing the tissue around the surgical site.  EXPAREL is designed to release pain medication over time and can control pain for up to 72 hours.  Depending on how you respond to EXPAREL, you may require less pain medication during your recovery.  Possible side effects:  Temporary loss of sensation or ability to move in the area where bupivacaine was injected.  Nausea, vomiting, constipation  Rarely, numbness and tingling in your mouth or lips, lightheadedness, or anxiety may occur.  Call your doctor right away if you think you may be experiencing any of these sensations, or if you have other questions regarding possible side effects.  Follow all other discharge instructions given to you by your surgeon or nurse. Eat a healthy diet and drink plenty of water or other fluids.  If you return to the hospital for any reason within 96 hours following the administration of EXPAREL, please inform your health care providers. Post Anesthesia Home Care Instructions  Activity: Get plenty of rest for the remainder of the day. A responsible individual must stay with you for 24 hours following the procedure.  For the next 24 hours, DO NOT: -Drive a car -Paediatric nurse -Drink alcoholic beverages -Take any medication unless instructed by your physician -Make any legal decisions or sign important papers.  Meals: Start with liquid foods such as gelatin or soup. Progress to regular foods as tolerated. Avoid greasy, spicy, heavy foods. If nausea and/or vomiting occur, drink only clear liquids until the nausea and/or vomiting subsides. Call your physician if vomiting continues.  Special Instructions/Symptoms: Your throat may feel dry or sore  from the anesthesia or the breathing tube placed in your throat during surgery. If this causes discomfort, gargle with warm salt water. The discomfort should disappear within 24 hours.  If you had a scopolamine patch placed behind your ear for the management of post- operative nausea and/or vomiting:  1. The medication in the patch is effective for 72 hours, after which it should be removed.  Wrap patch in a tissue and discard in the trash. Wash hands thoroughly with soap and water. 2. You may remove the patch earlier than 72 hours if you experience unpleasant side effects which may include dry mouth, dizziness or visual disturbances. 3. Avoid touching the patch. Wash your hands with soap and water after contact with the patch.

## 2019-09-21 NOTE — Anesthesia Postprocedure Evaluation (Signed)
Anesthesia Post Note  Patient: Benjamin Hall  Procedure(s) Performed: LAPAROSCOPIC BILATERAL INGUINAL HERNIA REPAIR WITH MESH (Bilateral ) PRIMARY UMBILICAL HERNIA REPAIR (N/A )     Patient location during evaluation: Phase II Anesthesia Type: General Level of consciousness: awake Pain management: pain level controlled Vital Signs Assessment: post-procedure vital signs reviewed and stable Respiratory status: spontaneous breathing Cardiovascular status: stable Postop Assessment: no apparent nausea or vomiting Anesthetic complications: no    Last Vitals:  Vitals:   09/21/19 1308 09/21/19 1315  BP:  108/72  Pulse: 79 77  Resp: 13 13  Temp:    SpO2: 100% 98%    Last Pain:  Vitals:   09/21/19 1345  TempSrc:   PainSc: 0-No pain   Pain Goal: Patients Stated Pain Goal: 4 (09/21/19 0821)                 [name]

## 2019-09-21 NOTE — Progress Notes (Signed)
Pt voided 30 cc's clear yellow urine.  Denies pain.  Pt agreeable to foley insertion.  Pt assisted to stretcher in PACU. #16 fr. Foley inserted w moderate difficulty. Pt tolerated procedure well. 550cc's clear yellow urine obtained.. pt and wife instructed to call [facility] office in am regarding cath removal.

## 2019-09-21 NOTE — Transfer of Care (Signed)
Immediate Anesthesia Transfer of Care Note  Patient: Benjamin Hall  Procedure(s) Performed: LAPAROSCOPIC BILATERAL INGUINAL HERNIA REPAIR WITH MESH (Bilateral ) PRIMARY UMBILICAL HERNIA REPAIR (N/A )  Patient Location: PACU  Anesthesia Type:General  Level of Consciousness: awake, alert , oriented and patient cooperative  Airway & Oxygen Therapy: Patient Spontanous Breathing and Patient connected to face mask oxygen  Post-op Assessment: Report given to RN and Post -op Vital signs reviewed and stable  Post vital signs: Reviewed and stable  Last Vitals:  Vitals Value Taken Time  BP 124/80 09/21/19 1239  Temp    Pulse 81 09/21/19 1241  Resp 15 09/21/19 1241  SpO2 95 % 09/21/19 1241  Vitals shown include unvalidated device data.  Last Pain:  Vitals:   09/21/19 0821  TempSrc: Oral      Patients Stated Pain Goal: 4 ([id number])  Complications: No apparent anesthesia complications

## 2019-09-21 NOTE — Op Note (Signed)
09/21/2019  1:00 PM  PATIENT:  Benjamin Hall  59 y.o. male  Patient Care Team: Emeterio Reeve, DO as PCP - General (Osteopathic Medicine) Michael Boston, MD as Consulting Physician (General Surgery) Kathie Rhodes, MD as Consulting Physician (Urology)  PRE-OPERATIVE DIAGNOSIS:  bilateral recurrent inguinal hernias, umbilical hernia  POST-OPERATIVE DIAGNOSIS:   Bilateral recurrent inguinal hernias Umbilical hernia  PROCEDURE:   LAPAROSCOPIC BILATERAL INGUINAL HERNIA REPAIR WITH MESH PRIMARY UMBILICAL HERNIA REPAIR TAP BLOCK - BILATERAL  SURGEON:  [name], MD  ASSISTANT: None  ANESTHESIA:     Regional ilioinguinal and genitofemoral and spermatic cord nerve blocks  General  Nerve block provided with liposomal bupivacaine (Experel) mixed with 0.25% bupivacaine as a Bilateral TAP block x 40mL each side at the level of the transverse abdominis & preperitoneal spaces along the flank at the anterior axillary line, from subcostal ridge to iliac crest under laparoscopic guidance    EBL:  Total I/O In: 893.8 [I.V.:720; IV Piggyback:173.8] Out: 50 [Blood:50].  See anesthesia record  Delay start of Pharmacological VTE agent (>24hrs) due to surgical blood loss or risk of bleeding:  no  DRAINS: NONE  SPECIMEN:  NONE  DISPOSITION OF SPECIMEN:  N/A  COUNTS:  YES  PLAN OF CARE: Discharge to home after PACU  PATIENT DISPOSITION:  PACU - hemodynamically stable.  INDICATION: Pleasant active gentleman with history of pediatric inguinal hernia repairs done when he was an infant.  Evidence of obvious right and probable left groin swollen suspicious for inguinal hernias.  Small but definite umbilical hernia as well I recommended laparoscopic exploration repair of hernias found.  The anatomy & physiology of the abdominal wall and pelvic floor was discussed.  The pathophysiology of hernias in the inguinal and pelvic region was discussed.  Natural history risks such as progressive  enlargement, pain, incarceration & strangulation was discussed.   Contributors to complications such as smoking, obesity, diabetes, prior surgery, etc were discussed.    I feel the risks of no intervention will lead to serious problems that outweigh the operative risks; therefore, I recommended surgery to reduce and repair the hernia.  I explained laparoscopic techniques with possible need for an open approach.  I noted usual use of mesh to patch and/or buttress hernia repair  Risks such as bleeding, infection, abscess, need for further treatment, heart attack, death, and other risks were discussed.  I noted a good likelihood this will help address the problem.   Goals of post-operative recovery were discussed as well.  Possibility that this will not correct all symptoms was explained.  I stressed the importance of low-impact activity, aggressive pain control, avoiding constipation, & not pushing through pain to minimize risk of post-operative chronic pain or injury. Possibility of reherniation was discussed.  We will work to minimize complications.     An educational handout further explaining the pathology & treatment options was given as well.  Questions were answered.  The patient expresses understanding & wishes to proceed with surgery.  OR FINDINGS: Bilateral indirect inguinal hernias.  No strong evidence of direct space, femoral, obturator hernias.  12 mm umbilical hernia through the stalk itself.  Not incarcerated.  Primarily repaired.  DESCRIPTION:  The patient was identified & brought into the operating room. The patient was positioned supine with arms tucked. SCDs were active during the entire case. The patient underwent general anesthesia without any difficulty.  The abdomen was prepped and draped in a sterile fashion. The patient's bladder was emptied.  A Surgical Timeout  confirmed our plan.  I made a transverse incision through the inferior umbilical fold.  I made a small transverse  nick through the anterior rectus fascia contralateral to the inguinal hernia side and placed a 0-vicryl stitch through the fascia.  I placed a Hasson trocar into the preperitoneal plane.  Entry was clean.  We induced carbon dioxide insufflation. Camera inspection revealed no injury.  I used a 60mm angled scope to bluntly free the peritoneum off the infraumbilical anterior abdominal wall.  I created enough of a preperitoneal pocket to place 37mm ports into the right & left mid-abdomen into this preperitoneal cavity.  I focused attention on the RIGHT pelvis since that was the dominant hernia side.   I used blunt & focused sharp dissection to free the peritoneum off the flank and down to the pubic rim.  I freed the anteriolateral bladder wall off the anteriolateral pelvic wall, sparing midline attachments.   I located a swath of peritoneum going into a hernia fascial defect at the  internal ring consistent with  an indirect inguinal hernia..  I gradually freed the peritoneal hernia sac off safely and reduced it into the preperitoneal space.  I freed the peritoneum off the spermatic vessels & vas deferens.  I freed peritoneum off the retroperitoneum along the psoas muscle.  Spermatic cord lipoma was dissected away & removed.  I checked & assured hemostasis.     I turned attention on the opposite  LEFT pelvis.  I did dissection in a similar, mirror-image fashion. The patient had an indirect inguinal hernia.Marland Kitchen   Spermatic cord lipoma was dissected away & removed.    I checked & assured hemostasis.     I chose 15x15 cm sheets of ultra-lightweight polypropylene mesh (Ultrapro), one for each side.  I cut a single sigmoid-shaped slit ~6cm from a corner of each mesh.  I placed the meshes into the preperitoneal space & laid them as overlapping diamonds such that at the inferior points, a 6x6 cm corner flap rested in the true anterolateral pelvis, covering the obturator & femoral foramina.   I allowed the bladder to return  to the pubis, this helping tuck the corners of the mesh in the anteriolateral pelvis.  The medial corners overlapped each other across midline cephalad to the pubic rim.   Given the numerous hernias of moderate size, I placed a third 15x15cm mesh in the center as a vertical diamond.  The lateral wings of the mesh overlap across the direct spaces and internal rings where the dominant hernias were.  This provided good coverage and reinforcement of the hernia repairs.  Because of the central mesh placement with good overlap, I did not place any tacks.   I redirected the left paramedian 5 mm port into the true peritoneal cavity and did diagnostic laparoscopy.  No evidence of bowel injury.  Mild omental adhesions in the left upper quadrant subcostal ridge.  Small but obvious umbilical hernia.  While he had a thin abdominal wall with some diastases recti, there is no evidence of any other hernias.  I freed the umbilical stalk off the fascia to confirm a small but definite umbilical stalk hernia only.  Because it was rather small, I decided to do primary repair only.  I held the hernia sacs cephalad & evacuated carbon dioxide.  I closed the umbilical hernia as well as the initial fascial defect with #1 PDS interrupted sutures in a transverse fashion.  I tacked the umbilical stalk back down with a  0 Vicryl suture.  I closed the skin using 4-0 monocryl stitch.  Sterile dressings were applied.   The patient was extubated & arrived in the PACU in stable condition..  I had discussed postoperative care with the patient in the holding area.  Instructions are written in the chart.  I discussed operative findings, updated the patient's status, discussed probable steps to recovery, and gave postoperative recommendations to the patient's spouse, Benjamin Hall, Benjamin Hall.  Recommendations were made.  Questions were answered.  She expressed understanding & appreciation.   [name], M.D., F.A.C.S. Gastrointestinal and Minimally  Invasive Surgery [facility] [address], [place], [place] [address] [phone] Main / Paging  09/21/2019 1:00 PM

## 2019-09-21 NOTE — Progress Notes (Signed)
Pt unable to empty. pvr 476 via scan.  Pt informed of order to cath if no void.  Pt requested to try again prior to insertion. Pt agreeable to 5pm check.  Pt encouraged to ambulate around unit to try to stimulate urge .

## 2019-09-21 NOTE — Anesthesia Procedure Notes (Signed)
Procedure Name: Intubation Date/Time: 09/21/2019 10:39 AM Performed by: Lollie Sails, CRNA Pre-anesthesia Checklist: Patient identified, Emergency Drugs available, Suction available, Patient being monitored and Timeout performed Patient Re-evaluated:Patient Re-evaluated prior to induction Oxygen Delivery Method: Circle system utilized Preoxygenation: Pre-oxygenation with 100% oxygen Induction Type: IV induction Ventilation: Mask ventilation without difficulty Laryngoscope Size: Miller and 3 Grade View: Grade II Tube type: Oral Tube size: 7.5 mm Number of attempts: 1 Airway Equipment and Method: Stylet Placement Confirmation: ETT inserted through vocal cords under direct vision,  positive ETCO2 and breath sounds checked- equal and bilateral Secured at: 22 cm Tube secured with: Tape Dental Injury: Teeth and Oropharynx as per pre-operative assessment

## 2019-10-09 DIAGNOSIS — R972 Elevated prostate specific antigen [PSA]: Secondary | ICD-10-CM | POA: Diagnosis not present

## 2019-11-09 ENCOUNTER — Ambulatory Visit: Payer: Federal, State, Local not specified - PPO | Attending: Internal Medicine

## 2019-11-09 DIAGNOSIS — Z23 Encounter for immunization: Secondary | ICD-10-CM

## 2019-11-09 NOTE — Progress Notes (Signed)
   Covid-19 Vaccination Clinic  Name:  Benjamin Hall    MRN: [id number] DOB: 1961/06/08  11/09/2019  Benjamin Hall was observed post Covid-19 immunization for 15 minutes without incident. He was provided with Vaccine Information Sheet and instruction to access the V-Safe system.   Benjamin Hall was instructed to call 911 with any severe reactions post vaccine: Marland Kitchen Difficulty breathing  . Swelling of face and throat  . A fast heartbeat  . A bad rash all over body  . Dizziness and weakness   Immunizations Administered    Name Date Dose VIS Date Route   Pfizer COVID-19 Vaccine 11/09/2019  1:46 PM 0.3 mL 07/07/2019 Intramuscular   Manufacturer: [place]   Lot: [id number]   [place]: [id number]

## 2019-12-04 ENCOUNTER — Ambulatory Visit: Payer: Federal, State, Local not specified - PPO | Attending: Internal Medicine

## 2019-12-04 DIAGNOSIS — Z23 Encounter for immunization: Secondary | ICD-10-CM

## 2019-12-04 NOTE — Progress Notes (Signed)
   Covid-19 Vaccination Clinic  Name:  Benjamin Hall    MRN: [id number] DOB: 02-15-1961  12/04/2019  [name] was observed post Covid-19 immunization for 15 minutes without incident. He was provided with Vaccine Information Sheet and instruction to access the V-Safe system.   [name] was instructed to call 911 with any severe reactions post vaccine: Marland Kitchen Difficulty breathing  . Swelling of face and throat  . A fast heartbeat  . A bad rash all over body  . Dizziness and weakness   Immunizations Administered    Name Date Dose VIS Date Route   Pfizer COVID-19 Vaccine 12/04/2019  8:43 AM 0.3 mL 09/20/2018 Intramuscular   Manufacturer: [place]   Lot: [id number]   [place]: [id number]

## 2019-12-29 ENCOUNTER — Other Ambulatory Visit: Payer: Self-pay

## 2019-12-29 ENCOUNTER — Encounter: Payer: Self-pay | Admitting: Osteopathic Medicine

## 2019-12-29 ENCOUNTER — Ambulatory Visit (INDEPENDENT_AMBULATORY_CARE_PROVIDER_SITE_OTHER): Payer: Federal, State, Local not specified - PPO | Admitting: Osteopathic Medicine

## 2019-12-29 VITALS — BP 110/72 | HR 98 | Temp 98.0°F | Ht 68.0 in | Wt 192.0 lb

## 2019-12-29 DIAGNOSIS — M79602 Pain in left arm: Secondary | ICD-10-CM

## 2019-12-29 DIAGNOSIS — R7309 Other abnormal glucose: Secondary | ICD-10-CM | POA: Diagnosis not present

## 2019-12-29 DIAGNOSIS — R972 Elevated prostate specific antigen [PSA]: Secondary | ICD-10-CM

## 2019-12-29 DIAGNOSIS — Z0000 Encounter for general adult medical examination without abnormal findings: Secondary | ICD-10-CM | POA: Diagnosis not present

## 2019-12-29 DIAGNOSIS — E782 Mixed hyperlipidemia: Secondary | ICD-10-CM | POA: Diagnosis not present

## 2019-12-29 NOTE — Progress Notes (Signed)
HPI: Benjamin Hall is a 59 y.o. male who  has a past medical history of Inguinal hernia, bilateral, Mixed hyperlipidemia (12/29/2018), Onychomycosis (0/01/3709), Umbilical hernia, and Wears glasses.  he presents to [facility] today, 12/29/19,  for chief complaint of: ANNUAL PHYSICAL   Established care here a year ago. 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   PSA elevated last lab check 02/2019 - following w/ urology, Bx benign   Arm pain on L, hurts to sleep on it and hurts w/ pressure but no joint pain or limited ROM or ADL    Past medical, surgical, social and family history reviewed:  Patient Active Problem List   Diagnosis Date Noted  . Elevated PSA 04/04/2019  . Onychomycosis 12/29/2018  . Mixed hyperlipidemia 12/29/2018    Past Surgical History:  Procedure Laterality Date  . COLONOSCOPY  08/2016  . [place]   per pt unsure where located  . INGUINAL HERNIA REPAIR Bilateral 09/21/2019   Procedure: LAPAROSCOPIC BILATERAL INGUINAL HERNIA REPAIR WITH MESH;  Surgeon: Michael Boston, MD;  Location: [facility];  Service: General;  Laterality: Bilateral;  . UMBILICAL HERNIA REPAIR N/A 09/21/2019   Procedure: PRIMARY UMBILICAL HERNIA REPAIR;  Surgeon: Michael Boston, MD;  Location: [place];  Service: General;  Laterality: N/A;  . WISDOM TOOTH EXTRACTION      Social History   Tobacco Use  . Smoking status: Never Smoker  . Smokeless tobacco: Never Used  Substance Use Topics  . Alcohol use: Yes    Alcohol/week: 7.0 standard drinks    Types: 7 Standard drinks or equivalent per week    Comment: one daily    Family History  Problem Relation Age of Onset  . Lung cancer Mother   . Heart attack Father      Current medication list and  allergy/intolerance information reviewed:    Current Outpatient Medications  Medication Sig Dispense Refill  . aspirin EC 81 MG tablet Take 81 mg by mouth daily.    . simvastatin (ZOCOR) 20 MG tablet Take 1 tablet (20 mg total) by mouth daily. (Patient taking differently: Take 20 mg by mouth daily. ) 90 tablet 3  . traMADol (ULTRAM) 50 MG tablet Take 1-2 tablets (50-100 mg total) by mouth every 6 (six) hours as needed for moderate pain or severe pain. (Patient not taking: Reported on 12/29/2019) 20 tablet 0   No current facility-administered medications for this visit.    Allergies  Allergen Reactions  . Penicillins Nausea And Vomiting    Patient states that as a child penicillin gave him an upset stomach     Exam:  BP 110/72 (BP Location: Right Arm, Patient Position: Sitting)   Pulse 98   Temp 98 F (36.7 C)   Ht 5\' 8"  (1.727 m)   Wt 192 lb (87.1 kg)   SpO2 98%   BMI 29.19 kg/m   Constitutional: VS see above. General Appearance: alert, well-developed, well-nourished, NAD  Eyes: Normal lids and conjunctive, non-icteric sclera  Neck: No masses, trachea midline. No thyroid enlargement. No tenderness/mass appreciated. No lymphadenopathy  Respiratory: Normal respiratory effort. no wheeze, no rhonchi, no rales  Cardiovascular: S1/S2 normal, no murmur, no rub/gallop auscultated. RRR. No lower extremity edema  Gastrointestinal: Nontender, no masses. No hepatomegaly, no splenomegaly. Bowel  sounds normal. Rectal exam deferred.   Musculoskeletal: Gait normal. No clubbing/cyanosis of digits. Normal L shoulder ROM and stability, apprehension test causes some pain near lateral tricep area   Neurological: Normal balance/coordination. No tremor.  Skin: warm, dry, intact. No rash/ulcer. No concerning nevi or subq nodules on limited exam.    Psychiatric: Normal judgment/insight. Normal mood and affect. Oriented x3.     ASSESSMENT/PLAN: The primary encounter diagnosis was Annual  physical exam. Diagnoses of Mixed hyperlipidemia, Elevated PSA, Elevated glucose level, and Left arm pain were also pertinent to this visit.  PT referral per request, Dr T if needed  Sleep - trial melatonin, call me if no better and can try trazodone   Orders Placed This Encounter  Procedures  . CBC  . COMPLETE METABOLIC PANEL WITH GFR  . Lipid panel  . Hemoglobin A1c  . PSA, Total with Reflex to PSA, Free  . Ambulatory referral to Physical Therapy    No orders of the defined types were placed in this encounter.   Patient Instructions  General Preventive Care  Most recent routine screening labs: ordered today.   Everyone should have blood pressure checked once per year. Goal 130/80 or less.   Tobacco: don't!   Alcohol: responsible moderation is ok for most adults - if you have concerns about your alcohol intake, please talk to me!   Exercise: as tolerated to reduce risk of cardiovascular disease and diabetes. Strength training will also prevent osteoporosis.   Mental health: if need for mental health care (medicines, counseling, other), or concerns about moods, please let me know!   Sexual health: if need for STD testing, or if concerns with libido/pain problems, please let me know!   Advanced Directive: Living Will and/or Healthcare Power of Attorney recommended for all adults, regardless of age or health.  Vaccines  Flu vaccine: for almost everyone, every fall.   Shingles vaccine: after age 72.   Pneumonia vaccines: after age 34  Tetanus booster: every 10 years   COVID vaccine: THANKS for taking your vaccine! :) Cancer screenings   Colon cancer screening: for everyone age 36-75. Colonoscopy available for all, many people also qualify for the Cologuard stool test   Prostate cancer screening: PSA blood test age 45-71  Lung cancer screening: not needed for non-smokers  Infection screenings  . HIV: recommended screening at least once age 66-65, more often as  needed. . Gonorrhea/Chlamydia: screening as needed . Hepatitis C: recommended once for everyone age 33-75 . TB: certain at-risk populations, or depending on work requirements and/or travel history Other . Bone Density Test: recommended for women at age 69, men at age 94, sooner depending on risk factors . Abdominal Aortic Aneurysm: screening with ultrasound recommended once for men age 13-75 who have ever smoked        Visit summary with medication list and pertinent instructions was printed for patient to review. All questions at time of visit were answered - patient instructed to contact office with any additional concerns or updates. ER/RTC precautions were reviewed with the patient.   Please note: voice recognition software was used to produce this document, and typos may escape review. Please contact Dr. Sheppard Coil for any needed clarifications.    Follow-up plan: Return in about 1 year (around 12/28/2020) for [place] (call week prior to visit for lab orders).

## 2019-12-29 NOTE — Patient Instructions (Signed)
General Preventive Care  Most recent routine screening labs: ordered today.   Everyone should have blood pressure checked once per year. Goal 130/80 or less.   Tobacco: don't!   Alcohol: responsible moderation is ok for most adults - if you have concerns about your alcohol intake, please talk to me!   Exercise: as tolerated to reduce risk of cardiovascular disease and diabetes. Strength training will also prevent osteoporosis.   Mental health: if need for mental health care (medicines, counseling, other), or concerns about moods, please let me know!   Sexual health: if need for STD testing, or if concerns with libido/pain problems, please let me know!   Advanced Directive: Living Will and/or Healthcare Power of Attorney recommended for all adults, regardless of age or health.  Vaccines  Flu vaccine: for almost everyone, every fall.   Shingles vaccine: after age 70.   Pneumonia vaccines: after age 31  Tetanus booster: every 10 years   COVID vaccine: THANKS for taking your vaccine! :) Cancer screenings   Colon cancer screening: for everyone age 26-75. Colonoscopy available for all, many people also qualify for the Cologuard stool test   Prostate cancer screening: PSA blood test age 53-71  Lung cancer screening: not needed for non-smokers  Infection screenings  . HIV: recommended screening at least once age 25-65, more often as needed. . Gonorrhea/Chlamydia: screening as needed . Hepatitis C: recommended once for everyone age 58-75 . TB: certain at-risk populations, or depending on work requirements and/or travel history Other . Bone Density Test: recommended for women at age 58, men at age 56, sooner depending on risk factors . Abdominal Aortic Aneurysm: screening with ultrasound recommended once for men age 41-75 who have ever smoked

## 2020-01-05 ENCOUNTER — Encounter: Payer: Self-pay | Admitting: Rehabilitative and Restorative Service Providers"

## 2020-01-05 ENCOUNTER — Encounter (INDEPENDENT_AMBULATORY_CARE_PROVIDER_SITE_OTHER): Payer: Self-pay

## 2020-01-05 ENCOUNTER — Ambulatory Visit (INDEPENDENT_AMBULATORY_CARE_PROVIDER_SITE_OTHER): Payer: Federal, State, Local not specified - PPO | Admitting: Rehabilitative and Restorative Service Providers"

## 2020-01-05 ENCOUNTER — Other Ambulatory Visit: Payer: Self-pay

## 2020-01-05 DIAGNOSIS — M79622 Pain in left upper arm: Secondary | ICD-10-CM | POA: Diagnosis not present

## 2020-01-05 DIAGNOSIS — M6281 Muscle weakness (generalized): Secondary | ICD-10-CM

## 2020-01-05 DIAGNOSIS — R29898 Other symptoms and signs involving the musculoskeletal system: Secondary | ICD-10-CM | POA: Diagnosis not present

## 2020-01-05 DIAGNOSIS — R293 Abnormal posture: Secondary | ICD-10-CM

## 2020-01-05 NOTE — Therapy (Signed)
[place], [place], [address] Phone: [phone]   Fax:  [phone]  Physical Therapy Evaluation  Patient Details  Name: Benjamin Hall MRN: [id number] Date of Birth: 1960/12/29 Referring Provider (PT): Dr Emeterio Reeve    Encounter Date: 01/05/2020   PT End of Session - 01/05/20 1316    Visit Number 1    Number of Visits 6    Date for PT Re-Evaluation 02/16/20    PT Start Time 1230    PT Stop Time 1314    PT Time Calculation (min) 44 min    Activity Tolerance Patient tolerated treatment well           Past Medical History:  Diagnosis Date  . Inguinal hernia, bilateral   . Mixed hyperlipidemia 12/29/2018  . Onychomycosis 12/29/2018  . Umbilical hernia   . Wears glasses     Past Surgical History:  Procedure Laterality Date  . COLONOSCOPY  08/2016  . [place]   per pt unsure where located  . INGUINAL HERNIA REPAIR Bilateral 09/21/2019   Procedure: LAPAROSCOPIC BILATERAL INGUINAL HERNIA REPAIR WITH MESH;  Surgeon: Michael Boston, MD;  Location: [facility];  Service: General;  Laterality: Bilateral;  . UMBILICAL HERNIA REPAIR N/A 09/21/2019   Procedure: PRIMARY UMBILICAL HERNIA REPAIR;  Surgeon: Michael Boston, MD;  Location: [facility];  Service: General;  Laterality: N/A;  . WISDOM TOOTH EXTRACTION      There were no vitals filed for this visit.    Subjective Assessment - 01/05/20 1231    Subjective Patient reports Lt arm pain with no known injury - shoulder to elbow - not elbow which has been present for the past 3 months with no significant change.    Pertinent History unremarkable - hernia repair 2/21    Patient Stated Goals get rid of arm pain    Currently in Pain? Yes    Pain Score 1     Pain Location Arm    Pain Orientation Left    Pain Descriptors / Indicators Nagging    Pain Type Acute pain    Pain Onset More than a month ago    Pain  Frequency Intermittent    Aggravating Factors  reaching to side and back; lying on the Lt side    Pain Relieving Factors avoiding the activities that irritate symptoms              [facility] PT Assessment - 01/05/20 0001      Assessment   Medical Diagnosis Lt arm pain     Referring Provider (PT) Dr Emeterio Reeve     Onset Date/Surgical Date 09/25/19    Hand Dominance Left    Next MD Visit PRN     Prior Therapy none       Precautions   Precautions None      Balance Screen   Has the patient fallen in the past 6 months No    Has the patient had a decrease in activity level because of a fear of falling?  No    Is the patient reluctant to leave their home because of a fear of falling?  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     Leisure yard work; walking ~  7 days/week x 45 min       Observation/Other Assessments   Focus on Therapeutic Outcomes (FOTO)  26% limitation       Sensation   Additional Comments WFL's per pt report       Posture/Postural Control   Posture Comments head forwrd; shoulders rounded and elevated       AROM   Right Shoulder Extension 60 Degrees    Right Shoulder Flexion 140 Degrees    Right Shoulder ABduction 145 Degrees    Right Shoulder Internal Rotation --   thumb to T10   Right Shoulder External Rotation 90 Degrees    Left Shoulder Extension 44 Degrees    Left Shoulder Flexion 134 Degrees   mild discomfort    Left Shoulder ABduction 148 Degrees   mild discomfort    Left Shoulder Internal Rotation --   hand to lateral sacrum - pain and tightness    Left Shoulder External Rotation 78 Degrees   pain    Cervical Flexion 68    Cervical Extension 45    Cervical - Right Side Bend 34    Cervical - Left Side Bend 23    Cervical - Right Rotation 67    Cervical - Left Rotation 65       Strength   Overall Strength Comments UE strength 5/5 excpt Lt triceps/elbow ext 4+/5       Palpation   Spinal mobility hypomobile thoracic and cervical spine     Palpation comment muscular tightness through the cervical spine and upper traps/leveator; pecs; teres; triceps Lt                       Objective measurements completed on examination: See above findings.       [place] Adult PT Treatment/Exercise - 01/05/20 0001      Neuro Re-ed    Neuro Re-ed Details  postural correction       Shoulder Exercises: Standing   Other Standing Exercises chin tuck 10 sec x 5; scap squeeze 10 sec x 5; L's x 10; W's x 10 with noodle       Shoulder Exercises: Stretch   Other Shoulder Stretches doorway stretch 3 positions x 30 sec x 2 reps each position (felt more in lower position)     Other Shoulder Stretches tirceps stretch 30 sec x 2 reps in door                   PT Education - 01/05/20 1308    Education Details HEP POC    Person(s) Educated Patient    Methods Explanation;Demonstration;Tactile cues;Verbal cues;Handout    Comprehension Verbalized understanding;Returned demonstration;Verbal cues required;Tactile cues required               PT Long Term Goals - 01/05/20 1520      PT LONG TERM GOAL #1   Title Decrease Lt arm pain by 75-100% allowing patient to return to all normal functional activities    Time 6    Period Weeks    Status New    Target Date 02/16/20      PT LONG TERM GOAL #2   Title Increase AROM Lt shoulder to equal or greater than AROM Rt shoulder    Time 6    Period Weeks    Status New    Target Date 02/01/20      PT LONG TERM GOAL #3   Title 5/5 strength Lt elbow extension  Time 6    Period Weeks    Status New    Target Date 02/01/20      PT LONG TERM GOAL #4   Title Independent in HEP    Time 6    Period Weeks    Status New    Target Date 02/01/20      PT LONG TERM GOAL #5   Title Improve FOTO to </= 21% limitation     Time 6    Period Weeks    Status New    Target Date 02/01/20                  Plan - 01/05/20 1316    Clinical Impression Statement Patient presents with Lt arm pain with no known injury. Symptoms present for the past 3 months. He has poor posture and alignment; limitted shoudler ROM; 4+/5 weakness Lt triceps; tenderness and tightness Lt triceps area; decreased functional activity tolerance Lt UE. Patient will start with initial HEP and return in one week for re-evaluation and progression of exercises as indicated.    Stability/Clinical Decision Making Stable/Uncomplicated    Clinical Decision Making Low    Rehab Potential Good    PT Frequency 1x / week    PT Duration 6 weeks    PT Treatment/Interventions Patient/family education;ADLs/Self Care Home Management;Cryotherapy;Electrical Stimulation;Iontophoresis 4mg /ml Dexamethasone;Moist Heat;Ultrasound;Therapeutic activities;Therapeutic exercise;Balance training;Neuromuscular re-education;Manual techniques;Dry needling;Taping    PT Next Visit Plan re-evaluate; review exercises; progress with stretching and strengthening as indicated - work on posture/thoracic extension    PT Home Exercise Plan [phone]           Patient will benefit from skilled therapeutic intervention in order to improve the following deficits and impairments:     Visit Diagnosis: Pain in left upper arm - Plan: PT plan of care cert/re-cert  Other symptoms and signs involving the musculoskeletal system - Plan: PT plan of care cert/re-cert  Muscle weakness (generalized) - Plan: PT plan of care cert/re-cert  Abnormal posture - Plan: PT plan of care cert/re-cert     Problem List Patient Active Problem List   Diagnosis Date Noted  . Elevated PSA 04/04/2019  . Onychomycosis 12/29/2018  . Mixed hyperlipidemia 12/29/2018    Fidelia Cathers Nilda Simmer PT, MPH  01/05/2020, 3:25 PM  [facility] [place], [place], [address] Phone: [phone]   Fax:  [phone]  Name: Mathan Darroch MRN: [id number] Date of Birth: 1961/04/29

## 2020-01-05 NOTE — Patient Instructions (Signed)
Access Code: [email or web address]: [email or web address]: 06/11/2021Prepared by: Ventura Leggitt HoltExercises  Seated Cervical Retraction - 2 x daily - 7 x weekly - 1 sets - 10 reps - 10 sec hold  Seated Scapular Retraction - 2 x daily - 7 x weekly - 1 sets - 10 reps - 10 sec hold  Shoulder External Rotation and Scapular Retraction - 2 x daily - 7 x weekly - 1 sets - 10 reps - 1-2 sec hold  Shoulder External Rotation in 45 Degrees Abduction - 2 x daily - 7 x weekly - 1 sets - 10 reps - 1-2 sec hold  Standing Overhead Triceps Stretch - 2 x daily - 7 x weekly - 3 reps - 1 sets - 30 sec hold  Doorway Pec Stretch at 60 Degrees Abduction - 3 x daily - 7 x weekly - 3 reps - 1 sets  Doorway Pec Stretch at 90 Degrees Abduction - 3 x daily - 7 x weekly - 3 reps - 1 sets - 30 seconds hold  Doorway Pec Stretch at 120 Degrees Abduction - 3 x daily - 7 x weekly - 3 reps - 1 sets - 30 second hold hold

## 2020-01-15 ENCOUNTER — Encounter: Payer: Self-pay | Admitting: Rehabilitative and Restorative Service Providers"

## 2020-01-15 ENCOUNTER — Ambulatory Visit (INDEPENDENT_AMBULATORY_CARE_PROVIDER_SITE_OTHER): Payer: Federal, State, Local not specified - PPO | Admitting: Rehabilitative and Restorative Service Providers"

## 2020-01-15 ENCOUNTER — Other Ambulatory Visit: Payer: Self-pay

## 2020-01-15 DIAGNOSIS — M79622 Pain in left upper arm: Secondary | ICD-10-CM

## 2020-01-15 DIAGNOSIS — R29898 Other symptoms and signs involving the musculoskeletal system: Secondary | ICD-10-CM

## 2020-01-15 DIAGNOSIS — R293 Abnormal posture: Secondary | ICD-10-CM

## 2020-01-15 DIAGNOSIS — M6281 Muscle weakness (generalized): Secondary | ICD-10-CM | POA: Diagnosis not present

## 2020-01-15 NOTE — Patient Instructions (Signed)
Access Code: [email or web address]: [email or web address]: 06/21/2021Prepared by: Naureen Benton HoltExercises  Seated Cervical Retraction - 2 x daily - 7 x weekly - 1 sets - 10 reps - 10 sec hold  Seated Scapular Retraction - 2 x daily - 7 x weekly - 1 sets - 10 reps - 10 sec hold  Shoulder External Rotation and Scapular Retraction - 2 x daily - 7 x weekly - 1 sets - 10 reps - 1-2 sec hold  Shoulder External Rotation in 45 Degrees Abduction - 2 x daily - 7 x weekly - 1 sets - 10 reps - 1-2 sec hold  Standing Overhead Triceps Stretch - 2 x daily - 7 x weekly - 3 reps - 1 sets - 30 sec hold  Doorway Pec Stretch at 60 Degrees Abduction - 3 x daily - 7 x weekly - 3 reps - 1 sets  Doorway Pec Stretch at 90 Degrees Abduction - 3 x daily - 7 x weekly - 3 reps - 1 sets - 30 seconds hold  Doorway Pec Stretch at 120 Degrees Abduction - 3 x daily - 7 x weekly - 3 reps - 1 sets - 30 second hold hold  Standing Shoulder Internal Rotation Stretch with Towel - 2 x daily - 7 x weekly - 1 sets - 3 reps - 30 sec hold  Shoulder External Rotation and Scapular Retraction with Resistance - 1 x daily - 7 x weekly - 1-3 sets - 10 reps - 3 sec hold  Standing Row with Anchored Resistance - 1 x daily - 7 x weekly - 1-3 sets - 10 reps - 3 sec hold  Shoulder Extension with Resistance - 1 x daily - 7 x weekly - 1-3 sets - 10 reps - 3 sec hold  Push-Up on Counter - 1 x daily - 7 x weekly - 1-3 sets - 10 reps - 3 sec hold  Plank on Counter - 1 x daily - 7 x weekly - 1 sets - 3 reps - 30 sec hold  Standing Plank on Wall - 1 x daily - 7 x weekly - 1 sets - 3 reps - 30-60 sec hold  Wall Push Up - 1 x daily - 7 x weekly - 1-3 sets - 10 reps - 3 sec hold  Hooklying Shoulder T - 1 x daily - 7 x weekly - 1 sets - 1 reps - 2-5 min hold  Seated Chair Push Ups - 1 x daily - 7 x weekly - 1 sets - 10 reps - 3 sec hold

## 2020-01-15 NOTE — Therapy (Addendum)
[place], [place], [address] Phone: [phone]   Fax:  [phone]  Physical Therapy Treatment  Patient Details  Name: Benjamin Hall MRN: [id number] Date of Birth: 06-Feb-1961 Referring Provider (PT): Dr Emeterio Reeve    Encounter Date: 01/15/2020   PT End of Session - 01/15/20 1430    Visit Number 2    Number of Visits 6    Date for PT Re-Evaluation 02/16/20    PT Start Time 8786    PT Stop Time 1514    PT Time Calculation (min) 46 min    Activity Tolerance Patient tolerated treatment well           Past Medical History:  Diagnosis Date  . Inguinal hernia, bilateral   . Mixed hyperlipidemia 12/29/2018  . Onychomycosis 12/29/2018  . Umbilical hernia   . Wears glasses     Past Surgical History:  Procedure Laterality Date  . COLONOSCOPY  08/2016  . [place]   per pt unsure where located  . INGUINAL HERNIA REPAIR Bilateral 09/21/2019   Procedure: LAPAROSCOPIC BILATERAL INGUINAL HERNIA REPAIR WITH MESH;  Surgeon: Michael Boston, MD;  Location: [facility];  Service: General;  Laterality: Bilateral;  . UMBILICAL HERNIA REPAIR N/A 09/21/2019   Procedure: PRIMARY UMBILICAL HERNIA REPAIR;  Surgeon: Michael Boston, MD;  Location: [facility];  Service: General;  Laterality: N/A;  . WISDOM TOOTH EXTRACTION      There were no vitals filed for this visit.   Subjective Assessment - 01/15/20 1430    Subjective Patient reports that there are three exercises that seem to slightly irritate the arm pain - or can feel the stiffness in that arm. Arm loosens during the course of the reps - may have lingering sensation for a few seconds after exercises. Not feeling as weak through the arm in the past week.    Currently in Pain? No/denies              [facility] PT Assessment - 01/15/20 0001      Assessment   Medical Diagnosis Lt arm pain     Referring Provider (PT)  Dr Emeterio Reeve     Onset Date/Surgical Date 09/25/19    Hand Dominance Left    Next MD Visit PRN     Prior Therapy none       AROM   Right Shoulder Extension 60 Degrees    Right Shoulder Flexion 140 Degrees    Right Shoulder ABduction 145 Degrees    Right Shoulder Internal Rotation --   thumb to T10   Right Shoulder External Rotation 90 Degrees    Left Shoulder Extension 60 Degrees    Left Shoulder Flexion 150 Degrees    Left Shoulder ABduction 158 Degrees    Left Shoulder Internal Rotation --   thumb to T12    Left Shoulder External Rotation 85 Degrees    Cervical - Right Side Bend 38    Cervical - Left Side Bend 30      Strength   Overall Strength Comments UE strength 5/5 excpt Lt triceps/elbow ext 5/5       Palpation   Spinal mobility hypomobile thoracic and cervical spine     Palpation comment muscular tightness through the cervical spine and upper traps/leveator; pecs; teres; triceps Lt  [facility] Adult PT Treatment/Exercise - 01/15/20 0001      Shoulder Exercises: Seated   Other Seated Exercises seated push up from yoga egg x 10       Shoulder Exercises: Standing   Extension Strengthening;Both;10 reps;Theraband    Theraband Level (Shoulder Extension) Level 4 (Blue)    Row Strengthening;Both;10 reps;Theraband    Theraband Level (Shoulder Row) Level 4 (Blue)    Retraction Strengthening;Both;10 reps;Theraband    Theraband Level (Shoulder Retraction) Level 2 (Red)    Other Standing Exercises chin tuck 10 sec x 5; scap squeeze 10 sec x 5; L's x 10; W's x 10 with noodle     Other Standing Exercises wall plank 30 sec x 3; wall push up 3 sec x 10       Shoulder Exercises: Stretch   Internal Rotation Stretch 3 reps   30 sec with strap    Other Shoulder Stretches doorway stretch 3 positions x 30 sec x 2 reps each position (felt more in lower position)     Other Shoulder Stretches prolonged snow angel ~ 2 min with noodle along spine  and across spine ~ 56mn                   PT Education - 01/15/20 1518    Education Details HEP strengthening    Person(s) Educated Patient    Methods Explanation;Demonstration;Tactile cues;Verbal cues;Handout    Comprehension Verbalized understanding;Returned demonstration;Verbal cues required;Tactile cues required               PT Long Term Goals - 01/05/20 1520      PT LONG TERM GOAL #1   Title Decrease Lt arm pain by 75-100% allowing patient to return to all normal functional activities    Time 6    Period Weeks    Status New    Target Date 02/16/20      PT LONG TERM GOAL #2   Title Increase AROM Lt shoulder to equal or greater than AROM Rt shoulder    Time 6    Period Weeks    Status New    Target Date 02/01/20      PT LONG TERM GOAL #3   Title 5/5 strength Lt elbow extension    Time 6    Period Weeks    Status New    Target Date 02/01/20      PT LONG TERM GOAL #4   Title Independent in HEP    Time 6    Period Weeks    Status New    Target Date 02/01/20      PT LONG TERM GOAL #5   Title Improve FOTO to </= 21% limitation    Time 6    Period Weeks    Status New    Target Date 02/01/20                 Plan - 01/15/20 1445    Clinical Impression Statement Decreasing symptoms. Pleased with progress. Demonstrates good gains in AROM and strength in Lt UE. Added strengthening exercises without difficulty.    Rehab Potential Good    PT Frequency 1x / week    PT Duration 6 weeks    PT Treatment/Interventions Patient/family education;ADLs/Self Care Home Management;Cryotherapy;Electrical Stimulation;Iontophoresis 440mml Dexamethasone;Moist Heat;Ultrasound;Therapeutic activities;Therapeutic exercise;Balance training;Neuromuscular re-education;Manual techniques;Dry needling;Taping    PT Next Visit Plan re-evaluate; review exercises; progress with stretching and strengthening as indicated - work on posture/thoracic extension add prone exercises if  pt returns  for exercise progression; resistive exercises with body weight - patient will call to schedule as needed.    PT Home Exercise Plan [email or web address]    Consulted and Agree with Plan of Care Patient           Patient will benefit from skilled therapeutic intervention in order to improve the following deficits and impairments:     Visit Diagnosis: Pain in left upper arm  Other symptoms and signs involving the musculoskeletal system  Muscle weakness (generalized)  Abnormal posture     Problem List Patient Active Problem List   Diagnosis Date Noted  . Elevated PSA 04/04/2019  . Onychomycosis 12/29/2018  . Mixed hyperlipidemia 12/29/2018    Kioni Stahl Nilda Simmer PT, MPH  01/15/2020, 4:29 PM  [facility] [address] [place], [place], [address] Phone: [phone]   Fax:  [phone]  Name: Benjamin Hall MRN: [id number] Date of Birth: 1961/01/03  PHYSICAL THERAPY DISCHARGE SUMMARY  Visits from Start of Care: 2  Current functional level related to goals / functional outcomes: See progress note for discharge status    Remaining deficits: Unknown    Education / Equipment: HEP  Plan: Patient agrees to discharge.  Patient goals were not met. Patient is being discharged due to not returning since the last visit.  ?????     Kameah Rawl P. Helene Kelp PT, MPH 02/08/20 9:32 AM

## 2020-03-06 ENCOUNTER — Other Ambulatory Visit: Payer: Self-pay | Admitting: Osteopathic Medicine

## 2020-03-25 DIAGNOSIS — E782 Mixed hyperlipidemia: Secondary | ICD-10-CM | POA: Diagnosis not present

## 2020-03-25 DIAGNOSIS — R972 Elevated prostate specific antigen [PSA]: Secondary | ICD-10-CM | POA: Diagnosis not present

## 2020-03-25 DIAGNOSIS — Z0000 Encounter for general adult medical examination without abnormal findings: Secondary | ICD-10-CM | POA: Diagnosis not present

## 2020-03-25 DIAGNOSIS — R7309 Other abnormal glucose: Secondary | ICD-10-CM | POA: Diagnosis not present

## 2020-03-26 LAB — HEMOGLOBIN A1C
Hgb A1c MFr Bld: 5.3 % of total Hgb (ref <5.7)
Mean Plasma Glucose: 105 (calc)
eAG (mmol/L): 5.8 (calc)

## 2020-03-26 LAB — COMPLETE METABOLIC PANEL WITH GFR
AG Ratio: 1.6 (calc) (ref 1.0–2.5)
ALT: 20 U/L (ref 9–46)
AST: 18 U/L (ref 10–35)
Albumin: 4.4 g/dL (ref 3.6–5.1)
Alkaline phosphatase (APISO): 53 U/L (ref 35–144)
BUN: 18 mg/dL (ref 7–25)
CO2: 28 mmol/L (ref 20–32)
Calcium: 9.3 mg/dL (ref 8.6–10.3)
Chloride: 102 mmol/L (ref 98–110)
Creat: 0.79 mg/dL (ref 0.70–1.33)
GFR, Est African American: 115 mL/min/{1.73_m2} (ref >=60)
GFR, Est Non African American: 99 mL/min/{1.73_m2} (ref >=60)
Globulin: 2.7 g/dL (calc) (ref 1.9–3.7)
Glucose, Bld: 104 mg/dL — ABNORMAL HIGH (ref 65–99)
Potassium: 4.4 mmol/L (ref 3.5–5.3)
Sodium: 137 mmol/L (ref 135–146)
Total Bilirubin: 0.7 mg/dL (ref 0.2–1.2)
Total Protein: 7.1 g/dL (ref 6.1–8.1)

## 2020-03-26 LAB — LIPID PANEL
Cholesterol: 176 mg/dL (ref <200)
HDL: 67 mg/dL (ref >=40)
LDL Cholesterol (Calc): 91 mg/dL (calc)
Non-HDL Cholesterol (Calc): 109 mg/dL (calc) (ref <130)
Total CHOL/HDL Ratio: 2.6 (calc) (ref <5.0)
Triglycerides: 86 mg/dL (ref <150)

## 2020-03-26 LAB — CBC
HCT: 51.8 % — ABNORMAL HIGH (ref 38.5–50.0)
Hemoglobin: 17.5 g/dL — ABNORMAL HIGH (ref 13.2–17.1)
MCH: 30.6 pg (ref 27.0–33.0)
MCHC: 33.8 g/dL (ref 32.0–36.0)
MCV: 90.7 fL (ref 80.0–100.0)
MPV: 9.4 fL (ref 7.5–12.5)
Platelets: 201 10*3/uL (ref 140–400)
RBC: 5.71 10*6/uL (ref 4.20–5.80)
RDW: 13.3 % (ref 11.0–15.0)
WBC: 4.8 10*3/uL (ref 3.8–10.8)

## 2020-04-02 ENCOUNTER — Encounter: Payer: Self-pay | Admitting: Osteopathic Medicine

## 2020-04-04 DIAGNOSIS — N400 Enlarged prostate without lower urinary tract symptoms: Secondary | ICD-10-CM | POA: Diagnosis not present

## 2020-04-11 DIAGNOSIS — R972 Elevated prostate specific antigen [PSA]: Secondary | ICD-10-CM | POA: Diagnosis not present

## 2020-04-11 DIAGNOSIS — N400 Enlarged prostate without lower urinary tract symptoms: Secondary | ICD-10-CM | POA: Diagnosis not present

## 2020-06-03 ENCOUNTER — Other Ambulatory Visit: Payer: Self-pay | Admitting: Osteopathic Medicine

## 2020-09-26 DIAGNOSIS — R972 Elevated prostate specific antigen [PSA]: Secondary | ICD-10-CM | POA: Diagnosis not present

## 2020-10-04 DIAGNOSIS — N400 Enlarged prostate without lower urinary tract symptoms: Secondary | ICD-10-CM | POA: Diagnosis not present

## 2020-10-04 DIAGNOSIS — R972 Elevated prostate specific antigen [PSA]: Secondary | ICD-10-CM | POA: Diagnosis not present

## 2020-12-17 ENCOUNTER — Telehealth: Payer: Self-pay

## 2020-12-17 DIAGNOSIS — Z0000 Encounter for general adult medical examination without abnormal findings: Secondary | ICD-10-CM

## 2020-12-17 DIAGNOSIS — R972 Elevated prostate specific antigen [PSA]: Secondary | ICD-10-CM

## 2020-12-17 DIAGNOSIS — R7309 Other abnormal glucose: Secondary | ICD-10-CM

## 2020-12-17 DIAGNOSIS — E782 Mixed hyperlipidemia: Secondary | ICD-10-CM

## 2020-12-17 NOTE — Telephone Encounter (Signed)
Pt called stating he has an upcoming appointment for his annual check. Requesting to complete his annual labs before his visit. Labs pended for review.

## 2020-12-18 NOTE — Telephone Encounter (Signed)
Orders signed, thanks!

## 2020-12-19 NOTE — Telephone Encounter (Signed)
Task completed. Left a detailed vm msg for pt regarding annual lab order and provider's note. Direct call back info provided.

## 2020-12-24 DIAGNOSIS — R7309 Other abnormal glucose: Secondary | ICD-10-CM | POA: Diagnosis not present

## 2020-12-24 DIAGNOSIS — R972 Elevated prostate specific antigen [PSA]: Secondary | ICD-10-CM | POA: Diagnosis not present

## 2020-12-24 DIAGNOSIS — E782 Mixed hyperlipidemia: Secondary | ICD-10-CM | POA: Diagnosis not present

## 2020-12-24 DIAGNOSIS — Z0000 Encounter for general adult medical examination without abnormal findings: Secondary | ICD-10-CM | POA: Diagnosis not present

## 2020-12-25 LAB — COMPLETE METABOLIC PANEL WITH GFR
AG Ratio: 1.7 (calc) (ref 1.0–2.5)
ALT: 21 U/L (ref 9–46)
AST: 15 U/L (ref 10–35)
Albumin: 4.3 g/dL (ref 3.6–5.1)
Alkaline phosphatase (APISO): 49 U/L (ref 35–144)
BUN: 15 mg/dL (ref 7–25)
CO2: 28 mmol/L (ref 20–32)
Calcium: 9.3 mg/dL (ref 8.6–10.3)
Chloride: 102 mmol/L (ref 98–110)
Creat: 0.9 mg/dL (ref 0.70–1.33)
GFR, Est African American: 108 mL/min/{1.73_m2} (ref >=60)
GFR, Est Non African American: 93 mL/min/{1.73_m2} (ref >=60)
Globulin: 2.6 g/dL (calc) (ref 1.9–3.7)
Glucose, Bld: 103 mg/dL — ABNORMAL HIGH (ref 65–99)
Potassium: 4 mmol/L (ref 3.5–5.3)
Sodium: 138 mmol/L (ref 135–146)
Total Bilirubin: 0.8 mg/dL (ref 0.2–1.2)
Total Protein: 6.9 g/dL (ref 6.1–8.1)

## 2020-12-25 LAB — REFLEX PSA, FREE
PSA, % Free: 17 % (calc) — ABNORMAL LOW (ref >25)
PSA, Free: 0.8 ng/mL

## 2020-12-25 LAB — LIPID PANEL W/REFLEX DIRECT LDL
Cholesterol: 179 mg/dL (ref <200)
HDL: 60 mg/dL (ref >=40)
LDL Cholesterol (Calc): 98 mg/dL (calc)
Non-HDL Cholesterol (Calc): 119 mg/dL (calc) (ref <130)
Total CHOL/HDL Ratio: 3 (calc) (ref <5.0)
Triglycerides: 111 mg/dL (ref <150)

## 2020-12-25 LAB — CBC
HCT: 47.6 % (ref 38.5–50.0)
Hemoglobin: 16.2 g/dL (ref 13.2–17.1)
MCH: 31.6 pg (ref 27.0–33.0)
MCHC: 34 g/dL (ref 32.0–36.0)
MCV: 93 fL (ref 80.0–100.0)
MPV: 9.1 fL (ref 7.5–12.5)
Platelets: 206 10*3/uL (ref 140–400)
RBC: 5.12 10*6/uL (ref 4.20–5.80)
RDW: 13.1 % (ref 11.0–15.0)
WBC: 5.6 10*3/uL (ref 3.8–10.8)

## 2020-12-25 LAB — PSA, TOTAL WITH REFLEX TO PSA, FREE: PSA, Total: 4.8 ng/mL — ABNORMAL HIGH (ref <=4.0)

## 2020-12-25 LAB — HEMOGLOBIN A1C
Hgb A1c MFr Bld: 5.4 % of total Hgb (ref <5.7)
Mean Plasma Glucose: 108 mg/dL
eAG (mmol/L): 6 mmol/L

## 2020-12-31 ENCOUNTER — Other Ambulatory Visit: Payer: Self-pay

## 2020-12-31 ENCOUNTER — Encounter: Payer: Self-pay | Admitting: Osteopathic Medicine

## 2020-12-31 ENCOUNTER — Ambulatory Visit (INDEPENDENT_AMBULATORY_CARE_PROVIDER_SITE_OTHER): Payer: Federal, State, Local not specified - PPO | Admitting: Osteopathic Medicine

## 2020-12-31 VITALS — BP 109/74 | HR 76 | Temp 97.9°F | Wt 188.1 lb

## 2020-12-31 DIAGNOSIS — E782 Mixed hyperlipidemia: Secondary | ICD-10-CM | POA: Diagnosis not present

## 2020-12-31 DIAGNOSIS — Z0000 Encounter for general adult medical examination without abnormal findings: Secondary | ICD-10-CM | POA: Diagnosis not present

## 2020-12-31 NOTE — Progress Notes (Signed)
Benjamin Hall is a 60 y.o. male who presents to  [place] at [facility]  today, 12/31/20, seeking care for the following:  . Annual physical      ASSESSMENT & PLAN with other pertinent findings:  The primary encounter diagnosis was Annual physical exam. A diagnosis of Mixed hyperlipidemia was also pertinent to this visit.    Patient Instructions  General Preventive Care  Most recent routine screening labs: see attached.   Blood pressure goal 130/80 or less.   Tobacco: don't!   Alcohol: responsible moderation is ok for most adults - if you have concerns about your alcohol intake, please talk to me!   Exercise: as tolerated to reduce risk of cardiovascular disease and diabetes. Strength training will also prevent osteoporosis.   Mental health: if need for mental health care (medicines, counseling, other), or concerns about moods, please let me know!   Sexual / Reproductive health: if need for STD testing, or if concerns with libido/pain problems, please let me know! If you need to discuss family planning, please let me know!   Advanced Directive: Living Will and/or Healthcare Power of Attorney recommended for all adults, regardless of age or health.  Vaccines  Flu vaccine: for almost everyone, every fall.   Shingles vaccine: after age 33. Call us for nurse visit if you decide you'd like this vaccine.   Pneumonia vaccines: after age 76.  Tetanus booster: every 10 years.   COVID vaccine: THANKS for getting your vaccine! :) BOOSTER STRONGLY RECOMMENDED  Cancer screenings   Colon cancer screening: for everyone age 48-75. Colonoscopy follow-up based on previous results   Prostate cancer screening: PSA blood test age 59-71  Lung cancer screening: not needed for non-smokers  Infection screenings  . HIV: recommended screening at least once age 65-65, more often as needed. Rondell Reams, other STI: screening as  needed . Hepatitis C: recommended once for everyone age 56-75 . TB: certain at-risk populations, or depending on work requirements and/or travel history Other . Bone Density Test: recommended for women at age 5 . Abdominal Aortic Aneurysm: screening with ultrasound recommended once for men age 50-75 who have ever smoked    No orders of the defined types were placed in this encounter.   No orders of the defined types were placed in this encounter.    See below for relevant physical exam findings  See below for recent lab and imaging results reviewed  Medications, allergies, PMH, PSH, SocH, [place] reviewed below    Follow-up instructions: Return in about 1 year (around 12/31/2021) for [place] (call week prior to visit for lab orders).                                        Exam:  BP 109/74 (BP Location: Left Arm, Patient Position: Sitting, Cuff Size: Normal)   Pulse 76   Temp 97.9 F (36.6 C) (Oral)   Wt 188 lb 1.9 oz (85.3 kg)   BMI 28.60 kg/m   Constitutional: VS see above. General Appearance: alert, well-developed, well-nourished, NAD  Neck: No masses, trachea midline.   Respiratory: Normal respiratory effort. no wheeze, no rhonchi, no rales  Cardiovascular: S1/S2 normal, no murmur, no rub/gallop auscultated. RRR.   Musculoskeletal: Gait normal. Symmetric and independent movement of all extremities  Abdominal: non-tender, non-distended, no appreciable organomegaly, neg Murphy's, BS WNLx4  Neurological: Normal balance/coordination. No  tremor.  Skin: warm, dry, intact.   Psychiatric: Normal judgment/insight. Normal mood and affect. Oriented x3.   Current Meds  Medication Sig  . aspirin EC 81 MG tablet Take 81 mg by mouth daily.  . simvastatin (ZOCOR) 20 MG tablet TAKE 1 TABLET (20 MG TOTAL) BY MOUTH DAILY. LABS FOR FURTHER REFILLS    Allergies  Allergen Reactions  . Penicillins Nausea And Vomiting    Patient states that as a  child penicillin gave him an upset stomach    Patient Active Problem List   Diagnosis Date Noted  . Elevated PSA 04/04/2019  . Onychomycosis 12/29/2018  . Mixed hyperlipidemia 12/29/2018    Family History  Problem Relation Age of Onset  . Lung cancer Mother   . Heart attack Father     Social History   Tobacco Use  Smoking Status Never Smoker  Smokeless Tobacco Never Used    Past Surgical History:  Procedure Laterality Date  . COLONOSCOPY  08/2016  . [place]   per pt unsure where located  . INGUINAL HERNIA REPAIR Bilateral 09/21/2019   Procedure: LAPAROSCOPIC BILATERAL INGUINAL HERNIA REPAIR WITH MESH;  Surgeon: Michael Boston, MD;  Location: [place];  Service: General;  Laterality: Bilateral;  . UMBILICAL HERNIA REPAIR N/A 09/21/2019   Procedure: PRIMARY UMBILICAL HERNIA REPAIR;  Surgeon: Michael Boston, MD;  Location: [place];  Service: General;  Laterality: N/A;  . WISDOM TOOTH EXTRACTION      Immunization History  Administered Date(s) Administered  . PFIZER(Purple Top)SARS-COV-2 Vaccination 11/09/2019, 12/04/2019  . Tdap 07/27/2012    Recent Results (from the past 2160 hour(s))  HgB A1c     Status: None   Collection Time: 12/24/20  7:05 AM  Result Value Ref Range   Hgb A1c MFr Bld 5.4 <5.7 % of total Hgb    Comment: For the purpose of screening for the presence of diabetes: . <5.7%       Consistent with the absence of diabetes 5.7-6.4%    Consistent with increased risk for diabetes             (prediabetes) > or =6.5%  Consistent with diabetes . This assay result is consistent with a decreased risk of diabetes. . Currently, no consensus exists regarding use of hemoglobin A1c for diagnosis of diabetes in children. . According to American Diabetes Association (ADA) guidelines, hemoglobin A1c <7.0% represents optimal control in non-pregnant diabetic patients. Different metrics may apply to specific patient  populations.  Standards of Medical Care in Diabetes(ADA). .    Mean Plasma Glucose 108 mg/dL   eAG (mmol/L) 6.0 mmol/L  Lipid Panel w/reflex Direct LDL     Status: None   Collection Time: 12/24/20  7:05 AM  Result Value Ref Range   Cholesterol 179 <200 mg/dL   HDL 60 > OR = 40 mg/dL   Triglycerides 111 <150 mg/dL   LDL Cholesterol (Calc) 98 mg/dL (calc)    Comment: Reference range: <100 . Desirable range <100 mg/dL for primary prevention;   <70 mg/dL for patients with CHD or diabetic patients  with > or = 2 CHD risk factors. Marland Kitchen LDL-C is now calculated using the Martin-Hopkins  calculation, which is a validated novel method providing  better accuracy than the Friedewald equation in the  estimation of LDL-C.  Cresenciano Genre et al. Annamaria Helling. 2979;892(11): 2061-2068  (http://education.[facility].com/faq/FAQ164)    Total CHOL/HDL Ratio 3.0 <5.0 (calc)   Non-HDL Cholesterol (Calc) 119 <130 mg/dL (calc)  Comment: For patients with diabetes plus 1 major ASCVD risk  factor, treating to a non-HDL-C goal of <100 mg/dL  (LDL-C of <70 mg/dL) is considered a therapeutic  option.   COMPLETE METABOLIC PANEL WITH GFR     Status: Abnormal   Collection Time: 12/24/20  7:05 AM  Result Value Ref Range   Glucose, Bld 103 (H) 65 - 99 mg/dL    Comment: .            Fasting reference interval . For someone without known diabetes, a glucose value between 100 and 125 mg/dL is consistent with prediabetes and should be confirmed with a follow-up test. .    BUN 15 7 - 25 mg/dL   Creat 0.90 0.70 - 1.33 mg/dL    Comment: For patients >106 years of age, the reference limit for Creatinine is approximately 13% higher for people identified as African-American. .    GFR, Est Non African American 93 > OR = 60 mL/min/1.57m2   GFR, Est African American 108 > OR = 60 mL/min/1.22m2   BUN/Creatinine Ratio NOT APPLICABLE 6 - 22 (calc)   Sodium 138 135 - 146 mmol/L   Potassium 4.0 3.5 - 5.3 mmol/L    Chloride 102 98 - 110 mmol/L   CO2 28 20 - 32 mmol/L   Calcium 9.3 8.6 - 10.3 mg/dL   Total Protein 6.9 6.1 - 8.1 g/dL   Albumin 4.3 3.6 - 5.1 g/dL   Globulin 2.6 1.9 - 3.7 g/dL (calc)   AG Ratio 1.7 1.0 - 2.5 (calc)   Total Bilirubin 0.8 0.2 - 1.2 mg/dL   Alkaline phosphatase (APISO) 49 35 - 144 U/L   AST 15 10 - 35 U/L   ALT 21 9 - 46 U/L  CBC     Status: None   Collection Time: 12/24/20  7:05 AM  Result Value Ref Range   WBC 5.6 3.8 - 10.8 Thousand/uL   RBC 5.12 4.20 - 5.80 Million/uL   Hemoglobin 16.2 13.2 - 17.1 g/dL   HCT 47.6 38.5 - 50.0 %   MCV 93.0 80.0 - 100.0 fL   MCH 31.6 27.0 - 33.0 pg   MCHC 34.0 32.0 - 36.0 g/dL   RDW 13.1 11.0 - 15.0 %   Platelets 206 140 - 400 Thousand/uL   MPV 9.1 7.5 - 12.5 fL  PSA, Total with Reflex to PSA, Free     Status: Abnormal   Collection Time: 12/24/20  7:05 AM  Result Value Ref Range   PSA, Total 4.8 (H) < OR = 4.0 ng/mL  reflex PSA, Free     Status: Abnormal   Collection Time: 12/24/20  7:05 AM  Result Value Ref Range   PSA, Free 0.8 ng/mL   PSA, % Free 17 (L) >25 % (calc)    Comment: . PSA(ng/mL)      Free PSA(%)     Estimated(x) Probability                                      of Cancer(as%) 0-2.5              (*)               Approx. 1 2.6-4.0(1)         0-27(2)                   24(3) 4.1-10(4)  0-10                      56                    11-15                     28                    16-20                     20                    21-25                     16                    >or =26                   8 >10(+)             N/A                      >50 . References:(1)Catalona et al.:Urology 60: 469-474 (2002)            (2)Catalona et al.:J.Urol 168: 922-925 (2002)               Free PSA(%)   Sensitivity(%)  Specificity(%)               < or = 25          85              19               < or = 30          93               9            (3)Catalona et al.:JAMA 277: 1452-1455 (1997)             (4)Catalona et al.:JAMA 279: 3846-6599 (1998) . (x)These estimates vary with age, ethnicity, family     history and DRE results. (*)The  diagnostic usefulness of % Free PSA has not been    established in patients with total PSA below 2.6 ng/mL (+)In men with PSA above 10 ng/mL, prostate cancer risk is    determined by total PSA alone. . The Total PSA value from this assay system is  standardized against the equimolar PSA standard.  The test result will be approximately 20% higher  when compared to the [facility] Total PSA  (Siemens assay). Comparison of serial PSA results  should be interpreted with this fact in mind. Marland Kitchen PSA was performed using the Beckman Coulter Immunoassay method. Values obtained from different assay methods cannot be used interchangeably. PSA levels, regardless of value, should not be interpreted as absolute evidence of the presence or absence of disease. .     No results found.     All questions at time of visit were answered - patient instructed to contact office with any additional concerns or updates. ER/RTC precautions were reviewed with the patient as applicable.   Please note: manual typing as well as voice recognition software may have been used to produce this document - typos may escape  review. Please contact Dr. Sheppard Coil for any needed clarifications.

## 2020-12-31 NOTE — Patient Instructions (Addendum)
General Preventive Care  Most recent routine screening labs: see attached.   Blood pressure goal 130/80 or less.   Tobacco: don't!   Alcohol: responsible moderation is ok for most adults - if you have concerns about your alcohol intake, please talk to me!   Exercise: as tolerated to reduce risk of cardiovascular disease and diabetes. Strength training will also prevent osteoporosis.   Mental health: if need for mental health care (medicines, counseling, other), or concerns about moods, please let me know!   Sexual / Reproductive health: if need for STD testing, or if concerns with libido/pain problems, please let me know! If you need to discuss family planning, please let me know!   Advanced Directive: Living Will and/or Healthcare Power of Attorney recommended for all adults, regardless of age or health.  Vaccines  Flu vaccine: for almost everyone, every fall.   Shingles vaccine: after age 59. Call us for nurse visit if you decide you'd like this vaccine.   Pneumonia vaccines: after age 59.  Tetanus booster: every 10 years.   COVID vaccine: THANKS for getting your vaccine! :) BOOSTER STRONGLY RECOMMENDED  Cancer screenings   Colon cancer screening: for everyone age 59-75. Colonoscopy follow-up based on previous results   Prostate cancer screening: PSA blood test age 59-71  Lung cancer screening: not needed for non-smokers  Infection screenings  . HIV: recommended screening at least once age 59-65, more often as needed. Rondell Reams, other STI: screening as needed . Hepatitis C: recommended once for everyone age 59-75 . TB: certain at-risk populations, or depending on work requirements and/or travel history Other . Bone Density Test: recommended for women at age 59 . Abdominal Aortic Aneurysm: screening with ultrasound recommended once for men age 59-75 who have ever smoked

## 2021-01-03 ENCOUNTER — Other Ambulatory Visit: Payer: Self-pay

## 2021-01-06 ENCOUNTER — Ambulatory Visit (INDEPENDENT_AMBULATORY_CARE_PROVIDER_SITE_OTHER): Payer: Federal, State, Local not specified - PPO | Admitting: Osteopathic Medicine

## 2021-01-06 ENCOUNTER — Other Ambulatory Visit: Payer: Self-pay

## 2021-01-06 VITALS — Temp 98.0°F

## 2021-01-06 DIAGNOSIS — Z23 Encounter for immunization: Secondary | ICD-10-CM

## 2021-01-06 NOTE — Progress Notes (Signed)
Established Patient Office Visit  Subjective:  Patient ID: Benjamin Hall, male    DOB: [birth year]  Age: 60 y.o. MRN: [id number]  CC:  Chief Complaint  Patient presents with   Immunizations    HPI Orton Capell presents for first shingles vaccine.   Past Medical History:  Diagnosis Date   Inguinal hernia, bilateral    Mixed hyperlipidemia 12/29/2018   Onychomycosis 02/01/5884   Umbilical hernia    Wears glasses     Past Surgical History:  Procedure Laterality Date   COLONOSCOPY  08/2016   HERNIA REPAIR  1964   per pt unsure where located   [place] Bilateral 09/21/2019   Procedure: LAPAROSCOPIC BILATERAL INGUINAL HERNIA REPAIR WITH MESH;  Surgeon: Michael Boston, MD;  Location: [place];  Service: General;  Laterality: Bilateral;   UMBILICAL HERNIA REPAIR N/A 09/21/2019   Procedure: PRIMARY UMBILICAL HERNIA REPAIR;  Surgeon: Michael Boston, MD;  Location: [place];  Service: General;  Laterality: N/A;   WISDOM TOOTH EXTRACTION      Family History  Problem Relation Age of Onset   Lung cancer Mother    Heart attack Father     Social History   Socioeconomic History   Marital status: Married    Spouse name: Not on file   Number of children: Not on file   Years of education: Not on file   Highest education level: Not on file  Occupational History   Not on file  Tobacco Use   Smoking status: Never   Smokeless tobacco: Never  Vaping Use   Vaping Use: Never used  Substance and Sexual Activity   Alcohol use: Yes    Alcohol/week: 7.0 standard drinks    Types: 7 Standard drinks or equivalent per week    Comment: one daily   Drug use: Never   Sexual activity: Not on file  Other Topics Concern   Not on file  Social History Narrative   Not on file   Social Determinants of Health   Financial Resource Strain: Not on file  Food Insecurity: Not on file  Transportation Needs: Not on file  Physical Activity: Not on file   Stress: Not on file  Social Connections: Not on file  Intimate Partner Violence: Not on file    Outpatient Medications Prior to Visit  Medication Sig Dispense Refill   aspirin EC 81 MG tablet Take 81 mg by mouth daily.     simvastatin (ZOCOR) 20 MG tablet TAKE 1 TABLET (20 MG TOTAL) BY MOUTH DAILY. LABS FOR FURTHER REFILLS 90 tablet 2   traMADol (ULTRAM) 50 MG tablet Take 1-2 tablets (50-100 mg total) by mouth every 6 (six) hours as needed for moderate pain or severe pain. (Patient not taking: No sig reported) 20 tablet 0   No facility-administered medications prior to visit.    Allergies  Allergen Reactions   Penicillins Nausea And Vomiting    Patient states that as a child penicillin gave him an upset stomach    ROS Review of Systems    Objective:    Physical Exam  Temp 98 F (36.7 C) (Temporal)  Wt Readings from Last 3 Encounters:  12/31/20 188 lb 1.9 oz (85.3 kg)  12/29/19 192 lb (87.1 kg)  09/21/19 185 lb (83.9 kg)     Health Maintenance Due  Topic Date Due   HIV Screening  Never done   Hepatitis C Screening  Never done   COLONOSCOPY (Pts 45-57yrs Insurance coverage  will need to be confirmed)  Never done    There are no preventive care reminders to display for this patient.  Lab Results  Component Value Date   TSH 2.91 03/08/2019   Lab Results  Component Value Date   WBC 5.6 12/24/2020   HGB 16.2 12/24/2020   HCT 47.6 12/24/2020   MCV 93.0 12/24/2020   PLT 206 12/24/2020   Lab Results  Component Value Date   NA 138 12/24/2020   K 4.0 12/24/2020   CO2 28 12/24/2020   GLUCOSE 103 (H) 12/24/2020   BUN 15 12/24/2020   CREATININE 0.90 12/24/2020   BILITOT 0.8 12/24/2020   AST 15 12/24/2020   ALT 21 12/24/2020   PROT 6.9 12/24/2020   CALCIUM 9.3 12/24/2020   Lab Results  Component Value Date   CHOL 179 12/24/2020   Lab Results  Component Value Date   HDL 60 12/24/2020   Lab Results  Component Value Date   LDLCALC 98 12/24/2020   Lab  Results  Component Value Date   TRIG 111 12/24/2020   Lab Results  Component Value Date   CHOLHDL 3.0 12/24/2020   Lab Results  Component Value Date   HGBA1C 5.4 12/24/2020      Assessment & Plan:  Shingles vaccine - Patient tolerated injection well without complications. Patient advised to schedule next injection 60 days from today.     Problem List Items Addressed This Visit   None Visit Diagnoses     Need for shingles vaccine    -  Primary   Relevant Orders   Varicella-zoster vaccine IM (Shingrix) (Completed)       No orders of the defined types were placed in this encounter.   Follow-up: Return in about 2 months (around 03/08/2021) for last shingles vaccine.    Lavell Luster, [place]

## 2021-01-09 ENCOUNTER — Other Ambulatory Visit: Payer: Self-pay

## 2021-01-09 ENCOUNTER — Ambulatory Visit (INDEPENDENT_AMBULATORY_CARE_PROVIDER_SITE_OTHER): Payer: Federal, State, Local not specified - PPO | Admitting: Osteopathic Medicine

## 2021-01-09 VITALS — BP 115/69 | HR 75 | Temp 97.8°F | Wt 189.1 lb

## 2021-01-09 DIAGNOSIS — M79601 Pain in right arm: Secondary | ICD-10-CM

## 2021-01-09 DIAGNOSIS — T50Z95A Adverse effect of other vaccines and biological substances, initial encounter: Secondary | ICD-10-CM | POA: Diagnosis not present

## 2021-01-09 NOTE — Progress Notes (Signed)
HPI: Benjamin Hall is a 60 y.o. male who  has a past medical history of Inguinal hernia, bilateral, Mixed hyperlipidemia (12/29/2018), Onychomycosis (11/30/4330), Umbilical hernia, and Wears glasses.  he presents to [facility] today, 01/09/21,  for chief complaint of:  Shingrix given 3 days ago. Reports reaction: R arm pain, R axillary pain and skin change in R axilla. On exam, there is some lymphadenopathy in R axillae but not sevrely enlarged, no erythema or warmth, there is mild ecchymosis R axilla, R deltoid appears normal but is mildly tender to palpation       ASSESSMENT/PLAN: The encounter diagnosis was Adverse effect of vaccine, initial encounter.  Appears to be uncomplicated immune response reaction, though the ecchymosis is a bit unusual if there was substantial lymphadenopathy and capillary leak that might explain this. Pt reprots it's improved from yesterday. I advised ok to take anti inflammatory Rx and monitor. He is agreeable. Precautions reviewed, if worse/change over the weekend seek urgent care help     Follow-up plan: Return if symptoms worsen or fail to improve.                                                 ################################################# ################################################# ################################################# #################################################    Current Meds  Medication Sig   aspirin EC 81 MG tablet Take 81 mg by mouth daily.   simvastatin (ZOCOR) 20 MG tablet TAKE 1 TABLET (20 MG TOTAL) BY MOUTH DAILY. LABS FOR FURTHER REFILLS    Allergies  Allergen Reactions   Penicillins Nausea And Vomiting    Patient states that as a child penicillin gave him an upset stomach       Review of Systems: Pertinent (+) and (-) ROS in HPI as above   Exam:  BP 115/69 (BP Location: Left Arm, Patient Position: Sitting, Cuff Size:  Normal)   Pulse 75   Temp 97.8 F (36.6 C) (Oral)   Wt 189 lb 1.9 oz (85.8 kg)   BMI 28.76 kg/m  Constitutional: VS see above. General Appearance: alert, well-developed, well-nourished, NAD Neck: No masses, trachea midline.  Respiratory: Normal respiratory effort. no wheeze, no rhonchi, no rales Cardiovascular: S1/S2 normal, no murmur, no rub/gallop auscultated. RRR.  Neurological: Normal balance/coordination. No tremor. Skin: warm, dry, intact.  Psychiatric: Normal judgment/insight. Normal mood and affect. Oriented x3.       Visit summary with medication list and pertinent instructions was printed for patient to review, patient was advised to alert [place] if any updates are needed. All questions at time of visit were answered - patient instructed to contact office with any additional concerns. ER/RTC precautions were reviewed with the patient and understanding verbalized.    Please note: voice recognition software was used to produce this document, and typos may escape review. Please contact Dr. Sheppard Coil for any needed clarifications.    Follow up plan: No follow-ups on file.

## 2021-02-26 NOTE — Progress Notes (Signed)
    Subjective:    CC: R thumb pain  I, Benjamin Hall, LAT, ATC, am serving as scribe for Dr. Lynne Leader.  HPI: Pt is a 60 y/o male presenting w/ c/o R thumb pain and lack of ROM x approximately 2-3 months.  He locates his pain to the R thumb IP joint.  He reports locking and complete lack of ROM at his R IP joint.  He notes the triggering of the thumb has been ongoing for at least 6 months and has been completely immobile at IP joint now for about 3 months.  R thumb swelling: No Aggravating factors: any attempts at R thumb IP flexion Treatments tried: Nothing  Pertinent review of Systems: No fevers or chills  Relevant historical information: Elevated PSA   Objective:    Vitals:   02/27/21 1033  BP: 104/78  Pulse: 68  SpO2: 98%   General: Well Developed, well nourished, and in no acute distress.   MSK: Right thumb normal-appearing immobile IP joint.  Manual attempted flexion IP joint causes pain and palmar MCP.  Palpable nodule felt at palmar MCP. Pulses cap refill and sensation are intact distally.    Impression and Recommendations:    Assessment and Plan: 60 y.o. male with right trigger thumb.  Patient has advanced right trigger thumb such to the extent that it is now effectively locked in the extended position.  This is ongoing now for greater than 2 or 3 months.  In situations like this I am not optimistic that injections will work very well.  He notes that in this context he would rather proceed to surgery then attempt injections if they are not likely to help.  I agree.  Plan to refer directly to hand surgery to discuss surgical options.Marland Kitchen  PDMP not reviewed this encounter. Orders Placed This Encounter  Procedures   Ambulatory referral to Orthopedic Surgery    Referral Priority:   Routine    Referral Type:   Surgical    Referral Reason:   Specialty Services Required    Requested Specialty:   Orthopedic Surgery    Number of Visits Requested:   1   No orders of  the defined types were placed in this encounter.   Discussed warning signs or symptoms. Please see discharge instructions. Patient expresses understanding.   The above documentation has been reviewed and is accurate and complete Lynne Leader, M.D.

## 2021-02-27 ENCOUNTER — Ambulatory Visit: Payer: Federal, State, Local not specified - PPO | Admitting: Family Medicine

## 2021-02-27 ENCOUNTER — Encounter: Payer: Self-pay | Admitting: Family Medicine

## 2021-02-27 ENCOUNTER — Other Ambulatory Visit: Payer: Self-pay

## 2021-02-27 ENCOUNTER — Ambulatory Visit: Payer: Self-pay

## 2021-02-27 VITALS — BP 104/78 | HR 68 | Ht 68.0 in | Wt 189.2 lb

## 2021-02-27 DIAGNOSIS — M79644 Pain in right finger(s): Secondary | ICD-10-CM

## 2021-02-27 DIAGNOSIS — M65311 Trigger thumb, right thumb: Secondary | ICD-10-CM

## 2021-02-27 NOTE — Patient Instructions (Addendum)
Nice to meet you today.  I think we can do an injection but I am not optimistic that it would help.   I think surgery is probably the best bet.   I have referred you to hand surgery.   Let me know if you have a problem.  I am happy to see you for any issue like this.

## 2021-03-05 DIAGNOSIS — M65311 Trigger thumb, right thumb: Secondary | ICD-10-CM | POA: Diagnosis not present

## 2021-03-05 DIAGNOSIS — M79644 Pain in right finger(s): Secondary | ICD-10-CM | POA: Diagnosis not present

## 2021-03-06 ENCOUNTER — Other Ambulatory Visit: Payer: Self-pay | Admitting: Osteopathic Medicine

## 2021-03-27 DIAGNOSIS — R972 Elevated prostate specific antigen [PSA]: Secondary | ICD-10-CM | POA: Diagnosis not present

## 2021-04-03 DIAGNOSIS — R972 Elevated prostate specific antigen [PSA]: Secondary | ICD-10-CM | POA: Diagnosis not present

## 2021-04-03 DIAGNOSIS — N400 Enlarged prostate without lower urinary tract symptoms: Secondary | ICD-10-CM | POA: Diagnosis not present

## 2021-04-14 ENCOUNTER — Ambulatory Visit: Payer: Federal, State, Local not specified - PPO

## 2021-04-16 DIAGNOSIS — M65311 Trigger thumb, right thumb: Secondary | ICD-10-CM | POA: Diagnosis not present

## 2021-05-20 ENCOUNTER — Ambulatory Visit: Payer: Federal, State, Local not specified - PPO | Admitting: Family Medicine

## 2021-05-22 ENCOUNTER — Other Ambulatory Visit: Payer: Self-pay

## 2021-05-22 ENCOUNTER — Ambulatory Visit: Payer: Federal, State, Local not specified - PPO | Admitting: Family Medicine

## 2021-05-22 ENCOUNTER — Encounter: Payer: Self-pay | Admitting: Family Medicine

## 2021-05-22 VITALS — BP 117/80 | HR 68 | Temp 97.7°F | Ht 68.0 in | Wt 186.7 lb

## 2021-05-22 DIAGNOSIS — R002 Palpitations: Secondary | ICD-10-CM

## 2021-05-22 NOTE — Progress Notes (Signed)
NSR normal qtc, normal axis  Benjamin Hall - 60 y.o. male MRN [id number]  Date of birth: 05-Nov-1960  Subjective Chief Complaint  Patient presents with   Establish Care    HPI Benjamin Hall is a 60 year old male here today for follow-up visit.  He is transferring care from Dr. Sheppard Coil.  He has history of hyperlipidemia and elevated PSA.  Hyperlipidemia is managed with simvastatin.  He has concerns today about intermittent palpitations.  These occur infrequently.  He has not had chest pain or dyspnea associated with this.  He denies dizziness.  Symptoms do not seem to worsen with exertion.  ROS:  A comprehensive ROS was completed and negative except as noted per HPI  Allergies  Allergen Reactions   Penicillins Nausea And Vomiting    Patient states that as a child penicillin gave him an upset stomach    Past Medical History:  Diagnosis Date   Inguinal hernia, bilateral    Mixed hyperlipidemia 12/29/2018   Onychomycosis 09/03/3149   Umbilical hernia    Wears glasses     Past Surgical History:  Procedure Laterality Date   COLONOSCOPY  08/2016   HERNIA REPAIR  1964   per pt unsure where located   [place] Bilateral 09/21/2019   Procedure: LAPAROSCOPIC BILATERAL INGUINAL HERNIA REPAIR WITH MESH;  Surgeon: Michael Boston, MD;  Location: [facility];  Service: General;  Laterality: Bilateral;   UMBILICAL HERNIA REPAIR N/A 09/21/2019   Procedure: PRIMARY UMBILICAL HERNIA REPAIR;  Surgeon: Michael Boston, MD;  Location: [facility];  Service: General;  Laterality: N/A;   WISDOM TOOTH EXTRACTION      Social History   Socioeconomic History   Marital status: Married    Spouse name: Not on file   Number of children: Not on file   Years of education: Not on file   Highest education level: Not on file  Occupational History   Not on file  Tobacco Use   Smoking status: Never   Smokeless tobacco: Never  Vaping Use   Vaping Use: Never used  Substance and  Sexual Activity   Alcohol use: Yes    Alcohol/week: 7.0 standard drinks    Types: 7 Standard drinks or equivalent per week    Comment: one daily   Drug use: Never   Sexual activity: Not on file  Other Topics Concern   Not on file  Social History Narrative   Not on file   Social Determinants of Health   Financial Resource Strain: Not on file  Food Insecurity: Not on file  Transportation Needs: Not on file  Physical Activity: Not on file  Stress: Not on file  Social Connections: Not on file    Family History  Problem Relation Age of Onset   Lung cancer Mother    Heart attack Father     Health Maintenance  Topic Date Due   HIV Screening  Never done   Hepatitis C Screening  Never done   COLONOSCOPY (Pts 45-33yrs Insurance coverage will need to be confirmed)  Never done   COVID-19 Vaccine (3 - Booster for [place] series) 01/29/2020   INFLUENZA VACCINE  Never done   Zoster Vaccines- Shingrix (2 of 2) 03/03/2021   TETANUS/TDAP  07/27/2022   Pneumococcal Vaccine 71-34 Years old  Aged Out   HPV VACCINES  Aged Out     ----------------------------------------------------------------------------------------------------------------------------------------------------------------------------------------------------------------- Physical Exam BP 117/80 (BP Location: Left Arm, Patient Position: Sitting, Cuff Size: Normal)   Pulse 68  Temp 97.7 F (36.5 C)   Ht 5\' 8"  (1.727 m)   Wt 186 lb 11.2 oz (84.7 kg)   SpO2 100%   BMI 28.39 kg/m   Physical Exam Constitutional:      Appearance: Normal appearance.  HENT:     Head: Normocephalic and atraumatic.  Eyes:     General: No scleral icterus. Cardiovascular:     Rate and Rhythm: Normal rate and regular rhythm.  Pulmonary:     Effort: Pulmonary effort is normal.     Breath sounds: Normal breath sounds.  Musculoskeletal:     Cervical back: Neck supple.  Neurological:     General: No focal deficit present.     Mental  Status: He is alert.  Psychiatric:        Mood and Affect: Mood normal.        Behavior: Behavior normal.   EKG: Normal sinus rhythm.  Normal axis, PR and QTc intervals. ------------------------------------------------------------------------------------------------------------------------------------------------------------------------------------------------------------------- Assessment and Plan  Palpitations Normal EKG.  These occur infrequently and he does not wish to pursue any further work-up at this time.  We discussed that if symptoms do worsen or increase in frequency we can consider Holter monitor and echocardiogram.   No orders of the defined types were placed in this encounter.   No follow-ups on file.    This visit occurred during the SARS-CoV-2 public health emergency.  Safety protocols were in place, including screening questions prior to the visit, additional usage of staff PPE, and extensive cleaning of exam room while observing appropriate contact time as indicated for disinfecting solutions.

## 2021-05-22 NOTE — Assessment & Plan Note (Signed)
Normal EKG.  These occur infrequently and he does not wish to pursue any further work-up at this time.  We discussed that if symptoms do worsen or increase in frequency we can consider Holter monitor and echocardiogram.

## 2021-06-06 ENCOUNTER — Other Ambulatory Visit: Payer: Self-pay | Admitting: Osteopathic Medicine

## 2021-06-30 ENCOUNTER — Other Ambulatory Visit: Payer: Self-pay

## 2021-06-30 ENCOUNTER — Ambulatory Visit: Payer: Federal, State, Local not specified - PPO

## 2021-07-07 ENCOUNTER — Ambulatory Visit (INDEPENDENT_AMBULATORY_CARE_PROVIDER_SITE_OTHER): Payer: Federal, State, Local not specified - PPO | Admitting: Family Medicine

## 2021-07-07 ENCOUNTER — Other Ambulatory Visit: Payer: Self-pay

## 2021-07-07 VITALS — BP 131/72 | HR 84 | Temp 97.9°F

## 2021-07-07 DIAGNOSIS — Z23 Encounter for immunization: Secondary | ICD-10-CM

## 2021-07-07 NOTE — Patient Instructions (Signed)
Recombinant Zoster (Shingles) Vaccine: What You Need to Know °1. Why get vaccinated? °Recombinant zoster (shingles) vaccine can prevent shingles. °Shingles (also called herpes zoster, or just zoster) is a painful skin rash, usually with blisters. In addition to the rash, shingles can cause fever, headache, chills, or upset stomach. Rarely, shingles can lead to complications such as pneumonia, hearing problems, blindness, brain inflammation (encephalitis), or death. °The risk of shingles increases with age. The most common complication of shingles is long-term nerve pain called postherpetic neuralgia (PHN). PHN occurs in the areas where the shingles rash was and can last for months or years after the rash goes away. The pain from PHN can be severe and debilitating. °The risk of PHN increases with age. An older adult with shingles is more likely to develop PHN and have longer lasting and more severe pain than a younger person. °People with weakened immune systems also have a higher risk of getting shingles and complications from the disease. °Shingles is caused by varicella-zoster virus, the same virus that causes chickenpox. After you have chickenpox, the virus stays in your body and can cause shingles later in life. Shingles cannot be passed from one person to another, but the virus that causes shingles can spread and cause chickenpox in someone who has never had chickenpox or has never received chickenpox vaccine. °2. Recombinant shingles vaccine °Recombinant shingles vaccine provides strong protection against shingles. By preventing shingles, recombinant shingles vaccine also protects against PHN and other complications. °Recombinant shingles vaccine is recommended for: °Adults 50 years and older °Adults 19 years and older who have a weakened immune system because of disease or treatments °Shingles vaccine is given as a two-dose series. For most people, the second dose should be given 2 to 6 months after the first  dose. Some people who have or will have a weakened immune system can get the second dose 1 to 2 months after the first dose. Ask your health care provider for guidance. °People who have had shingles in the past and people who have received varicella (chickenpox) vaccine are recommended to get recombinant shingles vaccine. The vaccine is also recommended for people who have already gotten another type of shingles vaccine, the live shingles vaccine. There is no live virus in recombinant shingles vaccine. °Shingles vaccine may be given at the same time as other vaccines. °3. Talk with your health care provider °Tell your vaccination provider if the person getting the vaccine: °Has had an allergic reaction after a previous dose of recombinant shingles vaccine, or has any severe, life-threatening allergies °Is currently experiencing an episode of shingles °Is pregnant °In some cases, your health care provider may decide to postpone shingles vaccination until a future visit. °People with minor illnesses, such as a cold, may be vaccinated. People who are moderately or severely ill should usually wait until they recover before getting recombinant shingles vaccine. °Your health care provider can give you more information. °4. Risks of a vaccine reaction °A sore arm with mild or moderate pain is very common after recombinant shingles vaccine. Redness and swelling can also happen at the site of the injection. °Tiredness, muscle pain, headache, shivering, fever, stomach pain, and nausea are common after recombinant shingles vaccine. °These side effects may temporarily prevent a vaccinated person from doing regular activities. Symptoms usually go away on their own in 2 to 3 days. You should still get the second dose of recombinant shingles vaccine even if you had one of these reactions after the first dose. °Guillain-Barré   syndrome (GBS), a serious nervous system disorder, has been reported very rarely after recombinant zoster  vaccine. °People sometimes faint after medical procedures, including vaccination. Tell your provider if you feel dizzy or have vision changes or ringing in the ears. °As with any medicine, there is a very remote chance of a vaccine causing a severe allergic reaction, other serious injury, or death. °5. What if there is a serious problem? °An allergic reaction could occur after the vaccinated person leaves the clinic. If you see signs of a severe allergic reaction (hives, swelling of the face and throat, difficulty breathing, a fast heartbeat, dizziness, or weakness), call 9-1-1 and get the person to the nearest hospital. °For other signs that concern you, call your health care provider. °Adverse reactions should be reported to the Vaccine Adverse Event Reporting System (VAERS). Your health care provider will usually file this report, or you can do it yourself. Visit the VAERS website at www.vaers.hhs.gov or call 1-800-822-7967. VAERS is only for reporting reactions, and VAERS staff members do not give medical advice. °6. How can I learn more? °Ask your health care provider. °Call your local or state health department. °Visit the website of the Food and Drug Administration (FDA) for vaccine package inserts and additional information at www.fda.gov/vaccinesblood-biologics/vaccines. °Contact the Centers for Disease Control and Prevention (CDC): °Call 1-800-232-4636 (1-800-CDC-INFO) or °Visit CDC's website at www.cdc.gov/vaccines. °Vaccine Information Statement Recombinant Zoster Vaccine (08/30/2020) °This information is not intended to replace advice given to you by your health care provider. Make sure you discuss any questions you have with your health care provider. °Document Revised: 03/28/2021 Document Reviewed: 09/13/2020 °Elsevier Patient Education © 2022 Elsevier Inc. ° °

## 2021-07-07 NOTE — Progress Notes (Signed)
Patient presents today for Shingrix immunization. This is the 2nd of 2 injections.   Patient denies CP, palpitations, ShOB, abd pain, headache, mood swings.   Immunization given IM in RD. Pt tolerated immunization well without complications. Advised pt to contact us should any complications arise.

## 2021-09-22 DIAGNOSIS — R972 Elevated prostate specific antigen [PSA]: Secondary | ICD-10-CM | POA: Diagnosis not present

## 2021-09-29 DIAGNOSIS — R972 Elevated prostate specific antigen [PSA]: Secondary | ICD-10-CM | POA: Diagnosis not present

## 2021-09-29 DIAGNOSIS — N400 Enlarged prostate without lower urinary tract symptoms: Secondary | ICD-10-CM | POA: Diagnosis not present

## 2021-10-21 ENCOUNTER — Encounter: Payer: Self-pay | Admitting: Family Medicine

## 2021-10-21 ENCOUNTER — Other Ambulatory Visit: Payer: Self-pay

## 2021-10-21 ENCOUNTER — Ambulatory Visit: Payer: Federal, State, Local not specified - PPO | Admitting: Family Medicine

## 2021-10-21 VITALS — BP 120/69 | HR 73 | Ht 68.0 in | Wt 186.1 lb

## 2021-10-21 DIAGNOSIS — R109 Unspecified abdominal pain: Secondary | ICD-10-CM | POA: Diagnosis not present

## 2021-10-21 DIAGNOSIS — K432 Incisional hernia without obstruction or gangrene: Secondary | ICD-10-CM

## 2021-10-21 NOTE — Assessment & Plan Note (Signed)
Concerned about possible recurrent incisional hernia.  CT of the abdomen and pelvis ordered with contrast for further evaluation. ?

## 2021-10-21 NOTE — Progress Notes (Signed)
?Benjamin Hall - 61 y.o. male MRN [id number]  Date of birth: 1960/10/17 ? ?Subjective ?Chief Complaint  ?Patient presents with  ? GI Problem  ? ? ?HPI ?Benjamin Hall is a 61 year old male here today with complaint of lower abdominal pain.  History of prior hernia surgery, he is concerned about possible recurrence.  He also feels like his abdomen is bloated at times.  Bowels are moving normally most of the time.  He denies any nausea or appetite changes. ? ?ROS:  A comprehensive ROS was completed and negative except as noted per HPI ? ?Allergies  ?Allergen Reactions  ? Penicillins Nausea And Vomiting  ?  Patient states that as a child penicillin gave him an upset stomach  ? ? ?Past Medical History:  ?Diagnosis Date  ? Inguinal hernia, bilateral   ? Mixed hyperlipidemia 12/29/2018  ? Onychomycosis 12/29/2018  ? Umbilical hernia   ? Wears glasses   ? ? ?Past Surgical History:  ?Procedure Laterality Date  ? COLONOSCOPY  08/2016  ? [place]  ? per pt unsure where located  ? INGUINAL HERNIA REPAIR Bilateral 09/21/2019  ? Procedure: LAPAROSCOPIC BILATERAL INGUINAL HERNIA REPAIR WITH MESH;  Surgeon: Michael Boston, MD;  Location: [facility];  Service: General;  Laterality: Bilateral;  ? UMBILICAL HERNIA REPAIR N/A 09/21/2019  ? Procedure: PRIMARY UMBILICAL HERNIA REPAIR;  Surgeon: Michael Boston, MD;  Location: [facility];  Service: General;  Laterality: N/A;  ? WISDOM TOOTH EXTRACTION    ? ? ?Social History  ? ?Socioeconomic History  ? Marital status: Married  ?  Spouse name: Not on file  ? Number of children: Not on file  ? Years of education: Not on file  ? Highest education level: Not on file  ?Occupational History  ? Not on file  ?Tobacco Use  ? Smoking status: Never  ? Smokeless tobacco: Never  ?Vaping Use  ? Vaping Use: Never used  ?Substance and Sexual Activity  ? Alcohol use: Yes  ?  Alcohol/week: 7.0 standard drinks  ?  Types: 7 Standard drinks or equivalent per week  ?  Comment: one daily   ? Drug use: Never  ? Sexual activity: Not on file  ?Other Topics Concern  ? Not on file  ?Social History Narrative  ? Not on file  ? ?Social Determinants of Health  ? ?Financial Resource Strain: Not on file  ?Food Insecurity: Not on file  ?Transportation Needs: Not on file  ?Physical Activity: Not on file  ?Stress: Not on file  ?Social Connections: Not on file  ? ? ?Family History  ?Problem Relation Age of Onset  ? Lung cancer Mother   ? Heart attack Father   ? ? ?Health Maintenance  ?Topic Date Due  ? INFLUENZA VACCINE  10/24/2021 (Originally 02/24/2021)  ? COVID-19 Vaccine (3 - Booster for [employer] series) 03/27/2022 (Originally 01/29/2020)  ? Hepatitis C Screening  07/07/2022 (Originally 10/02/1979)  ? HIV Screening  07/07/2022 (Originally 10/01/1976)  ? TETANUS/TDAP  07/27/2022  ? COLONOSCOPY (Pts 45-74yr Insurance coverage will need to be confirmed)  09/21/2026  ? Zoster Vaccines- Shingrix  Completed  ? HPV VACCINES  Aged Out  ? ? ? ?----------------------------------------------------------------------------------------------------------------------------------------------------------------------------------------------------------------- ?Physical Exam ?BP 120/69 (BP Location: Right Arm, Patient Position: Sitting, Cuff Size: Normal)   Pulse 73   Ht '5\' 8"'$  (1.727 m)   Wt 186 lb 1.3 oz (84.4 kg)   SpO2 100%   BMI 28.29 kg/m?  ? ?Physical Exam ?Constitutional:   ?  Appearance: Normal appearance.  ?Eyes:  ?   General: No scleral icterus. ?Cardiovascular:  ?   Rate and Rhythm: Normal rate and regular rhythm.  ?Pulmonary:  ?   Effort: Pulmonary effort is normal.  ?   Breath sounds: Normal breath sounds.  ?Abdominal:  ?   General: Abdomen is flat.  ?   Palpations: Abdomen is soft.  ?   Comments: Mild tenderness around previous umbilical incision, small protrusion however tends to scar tissue.  ?Musculoskeletal:  ?   Cervical back: Neck supple.  ?Neurological:  ?   Mental Status: He is alert.  ?Psychiatric:     ?   Mood  and Affect: Mood normal.     ?   Behavior: Behavior normal.  ? ? ?------------------------------------------------------------------------------------------------------------------------------------------------------------------------------------------------------------------- ?Assessment and Plan ? ?Abdominal pain ?Concerned about possible recurrent incisional hernia.  CT of the abdomen and pelvis ordered with contrast for further evaluation. ? ? ?No orders of the defined types were placed in this encounter. ? ? ?No follow-ups on file. ? ? ? ?This visit occurred during the SARS-CoV-2 public health emergency.  Safety protocols were in place, including screening questions prior to the visit, additional usage of staff PPE, and extensive cleaning of exam room while observing appropriate contact time as indicated for disinfecting solutions.  ? ?

## 2021-10-29 ENCOUNTER — Ambulatory Visit (INDEPENDENT_AMBULATORY_CARE_PROVIDER_SITE_OTHER): Payer: Federal, State, Local not specified - PPO

## 2021-10-29 DIAGNOSIS — K5730 Diverticulosis of large intestine without perforation or abscess without bleeding: Secondary | ICD-10-CM | POA: Diagnosis not present

## 2021-10-29 DIAGNOSIS — K432 Incisional hernia without obstruction or gangrene: Secondary | ICD-10-CM

## 2021-10-29 DIAGNOSIS — R109 Unspecified abdominal pain: Secondary | ICD-10-CM

## 2021-10-29 DIAGNOSIS — N400 Enlarged prostate without lower urinary tract symptoms: Secondary | ICD-10-CM | POA: Diagnosis not present

## 2021-10-29 DIAGNOSIS — K6389 Other specified diseases of intestine: Secondary | ICD-10-CM | POA: Diagnosis not present

## 2021-10-29 LAB — I-STAT CREATININE (MANUAL ENTRY): Creatinine, Ser: 0.9 (ref 0.50–1.10)

## 2021-10-29 MED ORDER — IOHEXOL 300 MG/ML  SOLN
100.0000 mL | Freq: Once | INTRAMUSCULAR | Status: AC | PRN
  Administered 2021-10-29: 100 mL via INTRAVENOUS

## 2021-11-07 ENCOUNTER — Telehealth: Payer: Self-pay

## 2021-11-07 ENCOUNTER — Other Ambulatory Visit: Payer: Self-pay | Admitting: Family Medicine

## 2021-11-07 DIAGNOSIS — R933 Abnormal findings on diagnostic imaging of other parts of digestive tract: Secondary | ICD-10-CM

## 2021-11-07 NOTE — Telephone Encounter (Signed)
PT lvm requesting call concerning CT results.  ?

## 2021-11-09 ENCOUNTER — Encounter: Payer: Self-pay | Admitting: Family Medicine

## 2021-11-13 ENCOUNTER — Telehealth: Payer: Self-pay | Admitting: Internal Medicine

## 2021-11-13 NOTE — Telephone Encounter (Signed)
Good afternoon Dr. Lorenso Courier, ? ?DoD for 11/13/21, p.m. ? ?This patient is requesting a transfer of care to our facility.  His last colonoscopy (report in Epic) was done in [place] in 2018.  He has recently had an abnormal CT scan (report also in [facility]) and his doctor wanted him to see a gastroenterologist to go over results with him.  Please advise if you approve of this transfer.   ? ?Thank you. ?

## 2021-11-14 ENCOUNTER — Encounter: Payer: Self-pay | Admitting: Internal Medicine

## 2021-12-09 ENCOUNTER — Ambulatory Visit: Payer: Federal, State, Local not specified - PPO | Admitting: Internal Medicine

## 2021-12-09 ENCOUNTER — Encounter: Payer: Self-pay | Admitting: Internal Medicine

## 2021-12-09 VITALS — BP 122/68 | HR 57 | Ht 68.0 in | Wt 177.6 lb

## 2021-12-09 DIAGNOSIS — R9389 Abnormal findings on diagnostic imaging of other specified body structures: Secondary | ICD-10-CM | POA: Diagnosis not present

## 2021-12-09 DIAGNOSIS — K5790 Diverticulosis of intestine, part unspecified, without perforation or abscess without bleeding: Secondary | ICD-10-CM

## 2021-12-09 NOTE — Patient Instructions (Signed)
If you are age 60 or older, your body mass index should be between 23-30. Your Body mass index is 27 kg/m?Marland Kitchen If this is out of the aforementioned range listed, please consider follow up with your Primary Care Provider. ? ?If you are age 10 or younger, your body mass index should be between 19-25. Your Body mass index is 27 kg/m?Marland Kitchen If this is out of the aformentioned range listed, please consider follow up with your Primary Care Provider.  ? ?________________________________________________________ ? ?The [facility] providers would like to encourage you to use [facility] to communicate with providers for non-urgent requests or questions.  Due to long hold times on the telephone, sending your provider a message by [facility] may be a faster and more efficient way to get a response.  Please allow 48 business hours for a response.  Please remember that this is for non-urgent requests.  ?_______________________________________________________ ? ?You have been scheduled for a colonoscopy. Please follow written instructions given to you at your visit today.  ?Please pick up your prep supplies at the pharmacy within the next 1-3 days. ?If you use inhalers (even only as needed), please bring them with you on the day of your procedure. ? ?

## 2021-12-09 NOTE — Progress Notes (Signed)
? ?Chief Complaint: Abnormal CT scan ? ?HPI : 61 year old male with history of umbilical hernia s/p repair and HLD presents with abnormal CT scan ? ?Three to four months ago, he started developing discomfort in the abdominal area. He was concerned that he may have caused a recurrence in his hernias due to some physical activity that he performed as part of his job. He has intentionally lost about 8-9 lbs over the last few months, which has helped with his abdominal discomfort. His ab pain has resolved since then. He had scant amounts of bleeding during his abdominal pain episode, which have resolved. Also had some increased diarrhea with the discomfort, which is now also better. He is on fiber supplements now. He has on average 2-4 BMs per day. Denies fam hx of colon cancer. Denies N&V or dysphagia.  ? ?Works in Counselling psychologist at [employer]. ? ?Past Medical History:  ?Diagnosis Date  ? Hyperlipidemia   ? Inguinal hernia, bilateral   ? Mixed hyperlipidemia 12/29/2018  ? Onychomycosis 12/29/2018  ? Umbilical hernia   ? Wears glasses   ? ?Past Surgical History:  ?Procedure Laterality Date  ? COLONOSCOPY  08/2016  ? [place]  ? per pt unsure where located  ? INGUINAL HERNIA REPAIR Bilateral 09/21/2019  ? Procedure: LAPAROSCOPIC BILATERAL INGUINAL HERNIA REPAIR WITH MESH;  Surgeon: Michael Boston, MD;  Location: [facility];  Service: General;  Laterality: Bilateral;  ? UMBILICAL HERNIA REPAIR N/A 09/21/2019  ? Procedure: PRIMARY UMBILICAL HERNIA REPAIR;  Surgeon: Michael Boston, MD;  Location: [facility];  Service: General;  Laterality: N/A;  ? WISDOM TOOTH EXTRACTION    ? ?Family History  ?Problem Relation Age of Onset  ? Lung cancer Mother   ? Heart disease Mother   ? Heart attack Father   ? Heart disease Father   ? Colon cancer Neg Hx   ? Stomach cancer Neg Hx   ? Esophageal cancer Neg Hx   ? Colon polyps Neg Hx   ? ?Social History  ? ?Tobacco Use  ? Smoking status: Never  ?  Smokeless tobacco: Never  ?Vaping Use  ? Vaping Use: Never used  ?Substance Use Topics  ? Alcohol use: Yes  ?  Alcohol/week: 7.0 standard drinks  ?  Types: 7 Standard drinks or equivalent per week  ?  Comment: one daily  ? Drug use: Never  ? ?Current Outpatient Medications  ?Medication Sig Dispense Refill  ? aspirin EC 81 MG tablet Take 81 mg by mouth daily.    ? simvastatin (ZOCOR) 20 MG tablet TAKE 1 TABLET BY MOUTH EVERY DAY 90 tablet 1  ? ?No current facility-administered medications for this visit.  ? ?Allergies  ?Allergen Reactions  ? Penicillins Nausea And Vomiting  ?  Patient states that as a child penicillin gave him an upset stomach  ? ?Review of Systems: ?All systems reviewed and negative except where noted in HPI.  ? ?Physical Exam: ?BP 122/68   Pulse (!) 57   Ht '5\' 8"'$  (1.727 m)   Wt 177 lb 9.3 oz (80.5 kg)   SpO2 97%   BMI 27.00 kg/m?  ?Constitutional: Pleasant,well-developed, male in no acute distress. ?HEENT: Normocephalic and atraumatic. Conjunctivae are normal. No scleral icterus. ?Cardiovascular: Normal rate, regular rhythm.  ?Pulmonary/chest: Effort normal and breath sounds normal. No wheezing, rales or rhonchi. ?Abdominal: Soft, nondistended, nontender. Bowel sounds active throughout. There are no masses palpable. No hepatomegaly. ?Extremities: No edema ?Neurological: Alert  and oriented to person place and time. ?Skin: Skin is warm and dry. No rashes noted. ?Psychiatric: Normal mood and affect. Behavior is normal. ? ?Labs 11/2020: CBC and CMP unremarkable. ? ?CT A/P w/contrast 10/29/21: ?IMPRESSION: ?1. Prior bilateral inguinal hernia and umbilical hernia repairs ?without recurrence. ?2. Sigmoid colonic diverticulosis with mild short segment sigmoid colonic wall thickening in a region of diverticulosis, which likely reflects a combination of underdistention and chronic inflammation. However underlying neoplasm not excluded consider correlation with findings at colonoscopy. ?3. Nonspecific  heterogeneous enhancement of a mildly enlarged prostate with a volume of 49 cc. ?4. Moderate volume of formed stool throughout colon suggestive of constipation. ? ?Colonoscopy 09/21/16: Internal hemorrhoids. Otherwise normal. ? ?ASSESSMENT AND PLAN: ?Abnormal CT scan ?Diverticulosis ?Patient presents with an episode of ab pain, diarrhea, and rectal bleeding 3-4 months ago that has since resolved. He had an abnormal CT scan that showed sigmoid colon diverticulosis with some colonic wall thickening in than area of diverticulosis. At this time I am suspicious that patient may have had an episode of diverticulitis or gastroenteritis that has since resolved. Since patient's last colonoscopy was in 2018, will plan for colonoscopy for further evaluation of sigmoid thickening seen on CT scan. ?- Continue fiber supplementation ?- Colonoscopy [facility] ? ?Christia Reading, MD ? ?

## 2021-12-12 ENCOUNTER — Other Ambulatory Visit: Payer: Self-pay | Admitting: Family Medicine

## 2021-12-29 ENCOUNTER — Encounter: Payer: Self-pay | Admitting: Internal Medicine

## 2021-12-29 ENCOUNTER — Ambulatory Visit (AMBULATORY_SURGERY_CENTER): Payer: Federal, State, Local not specified - PPO | Admitting: Internal Medicine

## 2021-12-29 VITALS — BP 104/65 | HR 77 | Temp 97.5°F | Resp 13 | Ht 68.0 in | Wt 177.0 lb

## 2021-12-29 DIAGNOSIS — K5730 Diverticulosis of large intestine without perforation or abscess without bleeding: Secondary | ICD-10-CM | POA: Diagnosis not present

## 2021-12-29 DIAGNOSIS — R9389 Abnormal findings on diagnostic imaging of other specified body structures: Secondary | ICD-10-CM

## 2021-12-29 DIAGNOSIS — D120 Benign neoplasm of cecum: Secondary | ICD-10-CM

## 2021-12-29 DIAGNOSIS — K6389 Other specified diseases of intestine: Secondary | ICD-10-CM | POA: Diagnosis not present

## 2021-12-29 DIAGNOSIS — K648 Other hemorrhoids: Secondary | ICD-10-CM | POA: Diagnosis not present

## 2021-12-29 DIAGNOSIS — K626 Ulcer of anus and rectum: Secondary | ICD-10-CM | POA: Diagnosis not present

## 2021-12-29 DIAGNOSIS — R933 Abnormal findings on diagnostic imaging of other parts of digestive tract: Secondary | ICD-10-CM | POA: Diagnosis not present

## 2021-12-29 DIAGNOSIS — K635 Polyp of colon: Secondary | ICD-10-CM

## 2021-12-29 HISTORY — PX: COLONOSCOPY: SHX174

## 2021-12-29 MED ORDER — SODIUM CHLORIDE 0.9 % IV SOLN: 500.0000 mL | Freq: Once | INTRAVENOUS | Status: DC

## 2021-12-29 NOTE — Progress Notes (Signed)
GASTROENTEROLOGY PROCEDURE H&P NOTE   Primary Care Physician: Luetta Nutting, DO    Reason for Procedure:   Colonic wall thickening on CT  Plan:    Colonoscopy  Patient is appropriate for endoscopic procedure(s) in the ambulatory ([place]) setting.  The nature of the procedure, as well as the risks, benefits, and alternatives were carefully and thoroughly reviewed with the patient. Ample time for discussion and questions allowed. The patient understood, was satisfied, and agreed to proceed.     HPI: Benjamin Hall is a 61 y.o. male who presents for colonoscopy for evaluation of colonic wall thickening .  Patient was most recently seen in the Gastroenterology Clinic on 12/09/21.  No interval change in medical history since that appointment. Please refer to that note for full details regarding GI history and clinical presentation.   Past Medical History:  Diagnosis Date   Hyperlipidemia    Inguinal hernia, bilateral    Mixed hyperlipidemia 12/29/2018   Onychomycosis 24/03/7352   Umbilical hernia    Wears glasses     Past Surgical History:  Procedure Laterality Date   COLONOSCOPY  08/2016   HERNIA REPAIR  1964   per pt unsure where located   [place] Bilateral 09/21/2019   Procedure: LAPAROSCOPIC BILATERAL INGUINAL HERNIA REPAIR WITH MESH;  Surgeon: Michael Boston, MD;  Location: [place];  Service: General;  Laterality: Bilateral;   UMBILICAL HERNIA REPAIR N/A 09/21/2019   Procedure: PRIMARY UMBILICAL HERNIA REPAIR;  Surgeon: Michael Boston, MD;  Location: [place];  Service: General;  Laterality: N/A;   WISDOM TOOTH EXTRACTION      Prior to Admission medications   Medication Sig Start Date End Date Taking? Authorizing Provider  aspirin EC 81 MG tablet Take 81 mg by mouth daily.   Yes [provider]  simvastatin (ZOCOR) 20 MG tablet TAKE 1 TABLET BY MOUTH EVERY DAY 12/12/21  Yes Luetta Nutting, DO    Current Outpatient  Medications  Medication Sig Dispense Refill   aspirin EC 81 MG tablet Take 81 mg by mouth daily.     simvastatin (ZOCOR) 20 MG tablet TAKE 1 TABLET BY MOUTH EVERY DAY 90 tablet 1   Current Facility-Administered Medications  Medication Dose Route Frequency Provider Last Rate Last Admin   0.9 %  sodium chloride infusion  500 mL Intravenous Once Sharyn Creamer, MD        Allergies as of 12/29/2021 - Review Complete 12/29/2021  Allergen Reaction Noted   Penicillins Nausea And Vomiting 10/29/2017    Family History  Problem Relation Age of Onset   Lung cancer Mother    Heart disease Mother    Heart attack Father    Heart disease Father    Colon cancer Neg Hx    Stomach cancer Neg Hx    Esophageal cancer Neg Hx    Colon polyps Neg Hx     Social History   Socioeconomic History   Marital status: Married    Spouse name: Not on file   Number of children: 0   Years of education: Not on file   Highest education level: Not on file  Occupational History   Occupation: [employer]  Tobacco Use   Smoking status: Never   Smokeless tobacco: Never  Vaping Use   Vaping Use: Never used  Substance and Sexual Activity   Alcohol use: Yes    Alcohol/week: 7.0 standard drinks    Types: 7 Standard drinks or equivalent per week  Comment: one daily   Drug use: Never   Sexual activity: Not on file  Other Topics Concern   Not on file  Social History Narrative   Not on file   Social Determinants of Health   Financial Resource Strain: Not on file  Food Insecurity: Not on file  Transportation Needs: Not on file  Physical Activity: Not on file  Stress: Not on file  Social Connections: Not on file  Intimate Partner Violence: Not on file    Physical Exam: Vital signs in last 24 hours: BP 116/80   Pulse 81   Temp (!) 97.5 F (36.4 C) (Temporal)   Ht '5\' 8"'$  (1.727 m)   Wt 177 lb (80.3 kg)   SpO2 99%   BMI 26.91 kg/m  GEN: NAD EYE: Sclerae anicteric ENT: MMM CV:  Non-tachycardic Pulm: No increased WOB GI: Soft NEURO:  Alert & Oriented   Christia Reading, MD [facility]   12/29/2021 8:32 AM

## 2021-12-29 NOTE — Progress Notes (Signed)
Pt's states no medical or surgical changes since previsit or office visit. 

## 2021-12-29 NOTE — Progress Notes (Signed)
Called to room to assist during endoscopic procedure.  Patient ID and intended procedure confirmed with present staff. Received instructions for my participation in the procedure from the performing physician.  

## 2021-12-29 NOTE — Op Note (Signed)
[place] Patient Name: Benjamin Hall Procedure Date: 12/29/2021 8:36 AM MRN: [id number] Endoscopist: Sonny Masters "Benjamin Hall ,  Age: 61 Referring MD:  Date of Birth: Jun 03, 1961 Gender: Male Account #: [id number] Procedure:                Colonoscopy Indications:              Abnormal CT of the GI tract Medicines:                Monitored Anesthesia Care Procedure:                Pre-Anesthesia Assessment:                           - Prior to the procedure, a History and Physical                            was performed, and patient medications and                            allergies were reviewed. The patient's tolerance of                            previous anesthesia was also reviewed. The risks                            and benefits of the procedure and the sedation                            options and risks were discussed with the patient.                            All questions were answered, and informed consent                            was obtained. Prior Anticoagulants: The patient has                            taken no previous anticoagulant or antiplatelet                            agents. ASA Grade Assessment: II - A patient with                            mild systemic disease. After reviewing the risks                            and benefits, the patient was deemed in                            satisfactory condition to undergo the procedure.                           After obtaining informed consent, the colonoscope  was passed under direct vision. Throughout the                            procedure, the patient's blood pressure, pulse, and                            oxygen saturations were monitored continuously. The                            Olympus CF-HQ190L [id number]) Colonoscope was                            introduced through the anus and advanced to the the                            terminal ileum. The colonoscopy was  performed                            without difficulty. The patient tolerated the                            procedure well. The quality of the bowel                            preparation was adequate. The terminal ileum,                            ileocecal valve, appendiceal orifice, and rectum                            were photographed. Scope In: 8:40:21 AM Scope Out: 9:04:16 AM Scope Withdrawal Time: 0 hours 20 minutes 38 seconds  Total Procedure Duration: 0 hours 23 minutes 55 seconds  Findings:                 The terminal ileum appeared normal.                           A 3 mm polyp was found in the cecum. The polyp was                            removed with a cold snare. Resection and retrieval                            were complete.                           Scattered inflammation characterized by congestion                            (edema), erythema and shallow ulcerations was found                            in the rectum, in the sigmoid colon, in the  descending colon and in the ascending colon.                            Biopsies were taken with a cold forceps for                            histology.                           Multiple small and large-mouthed diverticula were                            found in the sigmoid colon.                           Non-bleeding internal hemorrhoids were found during                            retroflexion. Complications:            No immediate complications. Estimated Blood Loss:     Estimated blood loss was minimal. Impression:               - The examined portion of the ileum was normal.                           - One 3 mm polyp in the cecum, removed with a cold                            snare. Resected and retrieved.                           - Scattered inflammation was found in the rectum,                            in the sigmoid colon, in the descending colon and                             in the ascending colon. Biopsied.                           - Diverticulosis in the sigmoid colon.                           - Non-bleeding internal hemorrhoids. Recommendation:           - Discharge patient to home (with escort).                           - Await pathology results.                           - The findings and recommendations were discussed                            with the patient. Sonny Masters "Benjamin Hall,  12/29/2021 9:13:08 AM

## 2021-12-29 NOTE — Patient Instructions (Signed)
Handouts given for diverticulosis and polyps.  Await pathology results.    YOU HAD AN ENDOSCOPIC PROCEDURE TODAY AT [facility]:   Refer to the procedure report that was given to you for any specific questions about what was found during the examination.  If the procedure report does not answer your questions, please call your gastroenterologist to clarify.  If you requested that your care partner not be given the details of your procedure findings, then the procedure report has been included in a sealed envelope for you to review at your convenience later.  YOU SHOULD EXPECT: Some feelings of bloating in the abdomen. Passage of more gas than usual.  Walking can help get rid of the air that was put into your GI tract during the procedure and reduce the bloating. If you had a lower endoscopy (such as a colonoscopy or flexible sigmoidoscopy) you may notice spotting of blood in your stool or on the toilet paper. If you underwent a bowel prep for your procedure, you may not have a normal bowel movement for a few days.  Please Note:  You might notice some irritation and congestion in your nose or some drainage.  This is from the oxygen used during your procedure.  There is no need for concern and it should clear up in a day or so.  SYMPTOMS TO REPORT IMMEDIATELY:  Following lower endoscopy (colonoscopy or flexible sigmoidoscopy):  Excessive amounts of blood in the stool  Significant tenderness or worsening of abdominal pains  Swelling of the abdomen that is new, acute  Fever of 100F or higher   For urgent or emergent issues, a gastroenterologist can be reached at any hour by calling [phone]. Do not use MyChart messaging for urgent concerns.    DIET:  We do recommend a small meal at first, but then you may proceed to your regular diet.  Drink plenty of fluids but you should avoid alcoholic beverages for 24 hours.  ACTIVITY:  You should plan to take it easy for the rest of  today and you should NOT DRIVE or use heavy machinery until tomorrow (because of the sedation medicines used during the test).    FOLLOW UP: Our staff will call the number listed on your records 24-72 hours following your procedure to check on you and address any questions or concerns that you may have regarding the information given to you following your procedure. If we do not reach you, we will leave a message.  We will attempt to reach you two times.  During this call, we will ask if you have developed any symptoms of COVID 19. If you develop any symptoms (ie: fever, flu-like symptoms, shortness of breath, cough etc.) before then, please call [phone].  If you test positive for Covid 19 in the 2 weeks post procedure, please call and report this information to [place].    If any biopsies were taken you will be contacted by phone or by letter within the next 1-3 weeks.  Please call us at [phone] if you have not heard about the biopsies in 3 weeks.    SIGNATURES/CONFIDENTIALITY: You and/or your care partner have signed paperwork which will be entered into your electronic medical record.  These signatures attest to the fact that that the information above on your After Visit Summary has been reviewed and is understood.  Full responsibility of the confidentiality of this discharge information lies with you and/or your care-partner.

## 2021-12-29 NOTE — Progress Notes (Signed)
Pt non-responsive, VVS, Report to RN  °

## 2021-12-30 ENCOUNTER — Telehealth: Payer: Self-pay

## 2021-12-30 NOTE — Telephone Encounter (Signed)
  Follow up Call-     12/29/2021    7:38 AM  Call back number  Post procedure Call Back phone  # [phone]  Permission to leave phone message Yes     Patient questions:  Do you have a fever, pain , or abdominal swelling? No. Pain Score  0 *  Have you tolerated food without any problems? Yes.    Have you been able to return to your normal activities? Yes.    Do you have any questions about your discharge instructions: Diet   No. Medications  No. Follow up visit  No.  Do you have questions or concerns about your Care? No.  Actions: * If pain score is 4 or above: No action needed, pain <4.

## 2022-01-01 ENCOUNTER — Encounter: Payer: Federal, State, Local not specified - PPO | Admitting: Osteopathic Medicine

## 2022-01-01 ENCOUNTER — Encounter: Payer: Federal, State, Local not specified - PPO | Admitting: Family Medicine

## 2022-01-01 ENCOUNTER — Encounter: Payer: Self-pay | Admitting: Internal Medicine

## 2022-02-25 ENCOUNTER — Other Ambulatory Visit: Payer: Self-pay

## 2022-02-25 ENCOUNTER — Telehealth: Payer: Self-pay | Admitting: Family Medicine

## 2022-02-25 DIAGNOSIS — Z0000 Encounter for general adult medical examination without abnormal findings: Secondary | ICD-10-CM

## 2022-02-25 DIAGNOSIS — R972 Elevated prostate specific antigen [PSA]: Secondary | ICD-10-CM

## 2022-02-25 DIAGNOSIS — E782 Mixed hyperlipidemia: Secondary | ICD-10-CM

## 2022-02-25 NOTE — Telephone Encounter (Signed)
Please call and schedule pt for lab appointment. Orders have been placed. Thanks  Fasting labs

## 2022-02-25 NOTE — Telephone Encounter (Signed)
Patient has Physical scheduled for 03/13/22 and wants Lab orders put in prior to his visit. AMUCK

## 2022-03-09 LAB — PSA, TOTAL AND FREE
PSA, % Free: 15 % (calc) — ABNORMAL LOW (ref >25)
PSA, Free: 0.6 ng/mL
PSA, Total: 4.1 ng/mL — ABNORMAL HIGH (ref <=4.0)

## 2022-03-13 ENCOUNTER — Encounter: Payer: Self-pay | Admitting: Family Medicine

## 2022-03-13 ENCOUNTER — Ambulatory Visit (INDEPENDENT_AMBULATORY_CARE_PROVIDER_SITE_OTHER): Payer: Federal, State, Local not specified - PPO | Admitting: Family Medicine

## 2022-03-13 VITALS — BP 114/69 | HR 74 | Ht 68.0 in | Wt 180.0 lb

## 2022-03-13 DIAGNOSIS — E782 Mixed hyperlipidemia: Secondary | ICD-10-CM | POA: Diagnosis not present

## 2022-03-13 DIAGNOSIS — Z0000 Encounter for general adult medical examination without abnormal findings: Secondary | ICD-10-CM | POA: Diagnosis not present

## 2022-03-13 DIAGNOSIS — R972 Elevated prostate specific antigen [PSA]: Secondary | ICD-10-CM

## 2022-03-13 NOTE — Assessment & Plan Note (Signed)
Well adult Orders Placed This Encounter  Procedures  . COMPLETE METABOLIC PANEL WITH GFR  . CBC with Differential  . Lipid Panel w/reflex Direct LDL  Screenings: Per lab orders Immunizations:  Declines flu vaccine. Anticipatory guidance/Risk factor reduction: Recommendations per AVS.

## 2022-03-13 NOTE — Progress Notes (Signed)
Benjamin Hall - 61 y.o. male MRN [id number]  Date of birth: 03-01-1961  Subjective No chief complaint on file.   HPI Benjamin Hall is a 61 y.o. male here today for annual exam.   Reports that he is doing well today.  He had labs completed prior to visit, however only PSA was drawn.  This remains stable and he also continues to see urology.    He has some concerns about varicose veins.  Denies pain.  He does stand quite bit.   He does try to stay pretty active.  Plans to get back into running.  He feels that diet is pretty good.   He is a non-smoker.  Occasional EtOH use.   Review of Systems  Constitutional:  Negative for chills, fever, malaise/fatigue and weight loss.  HENT:  Negative for congestion, ear pain and sore throat.   Eyes:  Negative for blurred vision, double vision and pain.  Respiratory:  Negative for cough and shortness of breath.   Cardiovascular:  Negative for chest pain and palpitations.  Gastrointestinal:  Negative for abdominal pain, blood in stool, constipation, heartburn and nausea.  Genitourinary:  Negative for dysuria and urgency.  Musculoskeletal:  Negative for joint pain and myalgias.  Neurological:  Negative for dizziness and headaches.  Endo/Heme/Allergies:  Does not bruise/bleed easily.  Psychiatric/Behavioral:  Negative for depression. The patient is not nervous/anxious and does not have insomnia.       Allergies  Allergen Reactions   Penicillins Nausea And Vomiting    Patient states that as a child penicillin gave him an upset stomach    Past Medical History:  Diagnosis Date   Hyperlipidemia    Inguinal hernia, bilateral    Mixed hyperlipidemia 12/29/2018   Onychomycosis 03/00/9233   Umbilical hernia    Wears glasses     Past Surgical History:  Procedure Laterality Date   COLONOSCOPY  08/2016   COLONOSCOPY  12/29/2021   HERNIA REPAIR  1964   per pt unsure where located   [place] Bilateral 09/21/2019   Procedure: LAPAROSCOPIC  BILATERAL INGUINAL HERNIA REPAIR WITH MESH;  Surgeon: Michael Boston, MD;  Location: [place];  Service: General;  Laterality: Bilateral;   UMBILICAL HERNIA REPAIR N/A 09/21/2019   Procedure: PRIMARY UMBILICAL HERNIA REPAIR;  Surgeon: Michael Boston, MD;  Location: [place];  Service: General;  Laterality: N/A;   WISDOM TOOTH EXTRACTION      Social History   Socioeconomic History   Marital status: Married    Spouse name: Not on file   Number of children: 0   Years of education: Not on file   Highest education level: Not on file  Occupational History   Occupation: [employer]  Tobacco Use   Smoking status: Never   Smokeless tobacco: Never  Vaping Use   Vaping Use: Never used  Substance and Sexual Activity   Alcohol use: Yes    Alcohol/week: 7.0 standard drinks of alcohol    Types: 7 Standard drinks or equivalent per week    Comment: one daily   Drug use: Never   Sexual activity: Not on file  Other Topics Concern   Not on file  Social History Narrative   Not on file   Social Determinants of Health   Financial Resource Strain: Not on file  Food Insecurity: Not on file  Transportation Needs: Not on file  Physical Activity: Not on file  Stress: Not on file  Social Connections: Not on file  Family History  Problem Relation Age of Onset   Lung cancer Mother    Heart disease Mother    Heart attack Father    Heart disease Father    Colon cancer Neg Hx    Stomach cancer Neg Hx    Esophageal cancer Neg Hx    Colon polyps Neg Hx     Health Maintenance  Topic Date Due   COVID-19 Vaccine (3 - Pfizer series) 03/27/2022 (Originally 01/29/2020)   Hepatitis C Screening  07/07/2022 (Originally 10/02/1979)   HIV Screening  07/07/2022 (Originally 10/01/1976)   INFLUENZA VACCINE  10/25/2022 (Originally 02/24/2022)   TETANUS/TDAP  07/27/2022   COLONOSCOPY (Pts 45-79yr Insurance coverage will need to be confirmed)  12/30/2026   Zoster  Vaccines- Shingrix  Completed   HPV VACCINES  Aged Out     ----------------------------------------------------------------------------------------------------------------------------------------------------------------------------------------------------------------- Physical Exam BP 114/69   Pulse 74   Ht '5\' 8"'$  (1.727 m)   Wt 180 lb (81.6 kg)   SpO2 98%   BMI 27.37 kg/m   Physical Exam Constitutional:      General: He is not in acute distress. HENT:     Head: Normocephalic and atraumatic.     Right Ear: Tympanic membrane and external ear normal.     Left Ear: Tympanic membrane and external ear normal.  Eyes:     General: No scleral icterus. Neck:     Thyroid: No thyromegaly.  Cardiovascular:     Rate and Rhythm: Normal rate and regular rhythm.     Heart sounds: Normal heart sounds.  Pulmonary:     Effort: Pulmonary effort is normal.     Breath sounds: Normal breath sounds.  Abdominal:     General: Bowel sounds are normal. There is no distension.     Palpations: Abdomen is soft.     Tenderness: There is no abdominal tenderness. There is no guarding.  Musculoskeletal:     Cervical back: Normal range of motion.  Lymphadenopathy:     Cervical: No cervical adenopathy.  Skin:    General: Skin is warm and dry.     Findings: No rash.  Neurological:     Mental Status: He is alert and oriented to person, place, and time.     Cranial Nerves: No cranial nerve deficit.     Motor: No abnormal muscle tone.  Psychiatric:        Mood and Affect: Mood normal.        Behavior: Behavior normal.     ------------------------------------------------------------------------------------------------------------------------------------------------------------------------------------------------------------------- Assessment and Plan  Well adult exam Well adult Orders Placed This Encounter  Procedures   COMPLETE METABOLIC PANEL WITH GFR   CBC with Differential   Lipid Panel  w/reflex Direct LDL  Screenings: Per lab orders Immunizations:  Declines flu vaccine. Anticipatory guidance/Risk factor reduction: Recommendations per AVS.     No orders of the defined types were placed in this encounter.   No follow-ups on file.    This visit occurred during the SARS-CoV-2 public health emergency.  Safety protocols were in place, including screening questions prior to the visit, additional usage of staff PPE, and extensive cleaning of exam room while observing appropriate contact time as indicated for disinfecting solutions.

## 2022-03-13 NOTE — Patient Instructions (Signed)

## 2022-04-06 DIAGNOSIS — N400 Enlarged prostate without lower urinary tract symptoms: Secondary | ICD-10-CM | POA: Diagnosis not present

## 2022-04-13 DIAGNOSIS — N400 Enlarged prostate without lower urinary tract symptoms: Secondary | ICD-10-CM | POA: Diagnosis not present

## 2022-04-13 DIAGNOSIS — R972 Elevated prostate specific antigen [PSA]: Secondary | ICD-10-CM | POA: Diagnosis not present

## 2022-06-14 ENCOUNTER — Other Ambulatory Visit: Payer: Self-pay | Admitting: Family Medicine

## 2022-07-24 ENCOUNTER — Encounter: Payer: Self-pay | Admitting: Sports Medicine

## 2022-07-24 ENCOUNTER — Ambulatory Visit: Payer: Federal, State, Local not specified - PPO | Admitting: Sports Medicine

## 2022-07-24 VITALS — BP 127/73 | HR 82 | Temp 98.2°F | Wt 179.0 lb

## 2022-07-24 DIAGNOSIS — J111 Influenza due to unidentified influenza virus with other respiratory manifestations: Secondary | ICD-10-CM

## 2022-07-24 DIAGNOSIS — J101 Influenza due to other identified influenza virus with other respiratory manifestations: Secondary | ICD-10-CM | POA: Diagnosis not present

## 2022-07-24 LAB — POCT INFLUENZA A/B
Influenza A, POC: POSITIVE — AB
Influenza B, POC: NEGATIVE

## 2022-07-24 MED ORDER — NAPROXEN 500 MG PO TABS: 500.0000 mg | ORAL_TABLET | Freq: Two times a day (BID) | ORAL | 3 refills | Status: DC

## 2022-07-24 MED ORDER — OSELTAMIVIR PHOSPHATE 75 MG PO CAPS: 75.0000 mg | ORAL_CAPSULE | Freq: Two times a day (BID) | ORAL | 0 refills | Status: DC

## 2022-07-24 NOTE — Assessment & Plan Note (Addendum)
Pleasant 61 year old male, has had about 2 days of muscle aches, body aches, chills, malaise, mild nausea, only mild cough and sneezing. No abdominal pain, no other GI symptoms. Rapid flu test today is positive, adding Tamiflu, should be out of work for a week and wear a mask around others.

## 2022-07-24 NOTE — Progress Notes (Signed)
    Procedures performed today:    None.  Independent interpretation of notes and tests performed by another provider:   None.  Brief History, Exam, Impression, and Recommendations:    Influenza A Pleasant 61 year old male, has had about 2 days of muscle aches, body aches, chills, malaise, mild nausea, only mild cough and sneezing. No abdominal pain, no other GI symptoms. Rapid flu test today is positive, adding Tamiflu, should be out of work for a week and wear a mask around others.  I spent 30 minutes of total time managing this patient today, this includes chart review, face to face, and non-face to face time.  ____________________________________________ Gwen Her. Dianah Field, M.D., ABFM., CAQSM., AME. Primary Care and Sports Medicine [facility]  Adjunct Professor of [place] [employer] [facility] [employer]  Risk manager

## 2022-10-12 ENCOUNTER — Ambulatory Visit: Payer: Federal, State, Local not specified - PPO | Admitting: Family Medicine

## 2022-10-12 ENCOUNTER — Encounter: Payer: Self-pay | Admitting: Family Medicine

## 2022-10-12 VITALS — BP 134/77 | HR 74 | Ht 68.0 in | Wt 178.0 lb

## 2022-10-12 DIAGNOSIS — R972 Elevated prostate specific antigen [PSA]: Secondary | ICD-10-CM

## 2022-10-12 DIAGNOSIS — R112 Nausea with vomiting, unspecified: Secondary | ICD-10-CM

## 2022-10-12 DIAGNOSIS — K529 Noninfective gastroenteritis and colitis, unspecified: Secondary | ICD-10-CM

## 2022-10-12 DIAGNOSIS — R3989 Other symptoms and signs involving the genitourinary system: Secondary | ICD-10-CM

## 2022-10-12 LAB — POCT URINALYSIS DIP (CLINITEK)
Bilirubin, UA: NEGATIVE
Blood, UA: NEGATIVE
Glucose, UA: NEGATIVE mg/dL
Ketones, POC UA: NEGATIVE mg/dL
Nitrite, UA: NEGATIVE
POC PROTEIN,UA: NEGATIVE
Spec Grav, UA: 1.02 (ref 1.010–1.025)
Urobilinogen, UA: 0.2 E.U./dL
pH, UA: 5.5 (ref 5.0–8.0)

## 2022-10-12 NOTE — Progress Notes (Signed)
Benjamin Hall - 62 y.o. male MRN [id number]  Date of birth: March 12, 1961  Subjective Chief Complaint  Patient presents with   Urinary Frequency    HPI Benjamin Hall is a 62 y.o. male here today with complaint of recent GI illness and dark urine.  He reports that a couple weeks ago he had illness with nausea, vomiting and diarrhea.  Symptoms lasted for a few days.  Vomiting was normal stomach contents.  Denies any blood in his diarrhea or dark tarry stools.  He does still have some minor rectal irritation from this.  GI symptoms have resolved at this point.  He did note some dark urine a few days after having GI symptoms.  He does admit that he was not hydrating very well.  Fever or chills.  No pain with urination.  No urinary frequency.  ROS:  A comprehensive ROS was completed and negative except as noted per HPI  Allergies  Allergen Reactions   Penicillins Nausea And Vomiting    Patient states that as a child penicillin gave him an upset stomach    Past Medical History:  Diagnosis Date   Hyperlipidemia    Inguinal hernia, bilateral    Mixed hyperlipidemia 12/29/2018   Onychomycosis 123456   Umbilical hernia    Wears glasses     Past Surgical History:  Procedure Laterality Date   COLONOSCOPY  08/2016   COLONOSCOPY  12/29/2021   HERNIA REPAIR  1964   per pt unsure where located   [place] Bilateral 09/21/2019   Procedure: LAPAROSCOPIC BILATERAL INGUINAL HERNIA REPAIR WITH MESH;  Surgeon: Michael Boston, MD;  Location: [place];  Service: General;  Laterality: Bilateral;   UMBILICAL HERNIA REPAIR N/A 09/21/2019   Procedure: PRIMARY UMBILICAL HERNIA REPAIR;  Surgeon: Michael Boston, MD;  Location: [place];  Service: General;  Laterality: N/A;   WISDOM TOOTH EXTRACTION      Social History   Socioeconomic History   Marital status: Married    Spouse name: Not on file   Number of children: 0   Years of education: Not on file    Highest education level: Not on file  Occupational History   Occupation: [employer]  Tobacco Use   Smoking status: Never   Smokeless tobacco: Never  Vaping Use   Vaping Use: Never used  Substance and Sexual Activity   Alcohol use: Yes    Alcohol/week: 7.0 standard drinks of alcohol    Types: 7 Standard drinks or equivalent per week    Comment: one daily   Drug use: Never   Sexual activity: Not on file  Other Topics Concern   Not on file  Social History Narrative   Not on file   Social Determinants of Health   Financial Resource Strain: Not on file  Food Insecurity: Not on file  Transportation Needs: Not on file  Physical Activity: Not on file  Stress: Not on file  Social Connections: Not on file    Family History  Problem Relation Age of Onset   Lung cancer Mother    Heart disease Mother    Heart attack Father    Heart disease Father    Colon cancer Neg Hx    Stomach cancer Neg Hx    Esophageal cancer Neg Hx    Colon polyps Neg Hx     Health Maintenance  Topic Date Due   DTaP/Tdap/Td (2 - Td or Tdap) 07/27/2022   INFLUENZA VACCINE  10/25/2022 (Originally  02/24/2022)   Hepatitis C Screening  10/12/2023 (Originally 10/02/1979)   HIV Screening  10/12/2023 (Originally 10/01/1976)   COVID-19 Vaccine (3 - 2023-24 season) 10/28/2023 (Originally 03/27/2022)   COLONOSCOPY (Pts 45-27yrs Insurance coverage will need to be confirmed)  12/30/2026   Zoster Vaccines- Shingrix  Completed   HPV VACCINES  Aged Out     ----------------------------------------------------------------------------------------------------------------------------------------------------------------------------------------------------------------- Physical Exam BP 134/77 (BP Location: Left Arm, Patient Position: Sitting, Cuff Size: Normal)   Pulse 74   Ht 5\' 8"  (1.727 m)   Wt 178 lb (80.7 kg)   SpO2 100%   BMI 27.06 kg/m   Physical Exam Constitutional:      Appearance: Normal appearance.   HENT:     Head: Normocephalic and atraumatic.  Eyes:     General: No scleral icterus. Cardiovascular:     Rate and Rhythm: Normal rate and regular rhythm.  Pulmonary:     Effort: Pulmonary effort is normal.     Breath sounds: Normal breath sounds.  Musculoskeletal:     Cervical back: Neck supple.  Neurological:     General: No focal deficit present.     Mental Status: He is alert.  Psychiatric:        Mood and Affect: Mood normal.        Behavior: Behavior normal.     ------------------------------------------------------------------------------------------------------------------------------------------------------------------------------------------------------------------- Assessment and Plan  Gastroenteritis Likely viral in nature.  Symptoms are essentially resolved at this point.  He did have some associated dark urine as well so we will go ahead and check metabolic profile and CBC.  Instructed to continue to hydrate well.  Urine discoloration Likely related to dehydration from his vomiting and diarrhea.  Improved with increase fluid intake.  Urinalysis with small leukocytes, sent for culture.  Recheck PSA levels as these been elevated before.   No orders of the defined types were placed in this encounter.   No follow-ups on file.    This visit occurred during the SARS-CoV-2 public health emergency.  Safety protocols were in place, including screening questions prior to the visit, additional usage of staff PPE, and extensive cleaning of exam room while observing appropriate contact time as indicated for disinfecting solutions.

## 2022-10-12 NOTE — Assessment & Plan Note (Signed)
Likely viral in nature.  Symptoms are essentially resolved at this point.  He did have some associated dark urine as well so we will go ahead and check metabolic profile and CBC.  Instructed to continue to hydrate well.

## 2022-10-12 NOTE — Assessment & Plan Note (Signed)
Likely related to dehydration from his vomiting and diarrhea.  Improved with increase fluid intake.  Urinalysis with small leukocytes, sent for culture.  Recheck PSA levels as these been elevated before.

## 2022-10-13 LAB — URINE CULTURE
MICRO NUMBER:: 14705387
Result:: NO GROWTH
SPECIMEN QUALITY:: ADEQUATE

## 2022-10-13 LAB — COMPLETE METABOLIC PANEL WITH GFR
AG Ratio: 1.8 (calc) (ref 1.0–2.5)
ALT: 30 U/L (ref 9–46)
AST: 20 U/L (ref 10–35)
Albumin: 4.5 g/dL (ref 3.6–5.1)
Alkaline phosphatase (APISO): 53 U/L (ref 35–144)
BUN: 13 mg/dL (ref 7–25)
CO2: 31 mmol/L (ref 20–32)
Calcium: 9.7 mg/dL (ref 8.6–10.3)
Chloride: 103 mmol/L (ref 98–110)
Creat: 0.86 mg/dL (ref 0.70–1.35)
Globulin: 2.5 g/dL (calc) (ref 1.9–3.7)
Glucose, Bld: 91 mg/dL (ref 65–99)
Potassium: 4.6 mmol/L (ref 3.5–5.3)
Sodium: 141 mmol/L (ref 135–146)
Total Bilirubin: 0.6 mg/dL (ref 0.2–1.2)
Total Protein: 7 g/dL (ref 6.1–8.1)
eGFR: 99 mL/min/{1.73_m2} (ref >=60)

## 2022-10-13 LAB — CBC WITH DIFFERENTIAL/PLATELET
Absolute Monocytes: 390 cells/uL (ref 200–950)
Basophils Absolute: 43 cells/uL (ref 0–200)
Basophils Relative: 0.7 %
Eosinophils Absolute: 153 cells/uL (ref 15–500)
Eosinophils Relative: 2.5 %
HCT: 46.9 % (ref 38.5–50.0)
Hemoglobin: 16.1 g/dL (ref 13.2–17.1)
Lymphs Abs: 1434 cells/uL (ref 850–3900)
MCH: 30.8 pg (ref 27.0–33.0)
MCHC: 34.3 g/dL (ref 32.0–36.0)
MCV: 89.8 fL (ref 80.0–100.0)
MPV: 9.2 fL (ref 7.5–12.5)
Monocytes Relative: 6.4 %
Neutro Abs: 4081 cells/uL (ref 1500–7800)
Neutrophils Relative %: 66.9 %
Platelets: 236 10*3/uL (ref 140–400)
RBC: 5.22 10*6/uL (ref 4.20–5.80)
RDW: 13 % (ref 11.0–15.0)
Total Lymphocyte: 23.5 %
WBC: 6.1 10*3/uL (ref 3.8–10.8)

## 2022-10-13 LAB — PSA: PSA: 5.2 ng/mL — ABNORMAL HIGH (ref <=4.00)

## 2022-10-20 ENCOUNTER — Encounter: Payer: Self-pay | Admitting: Family Medicine

## 2022-10-23 ENCOUNTER — Encounter: Payer: Self-pay | Admitting: Family Medicine

## 2022-10-29 DIAGNOSIS — R972 Elevated prostate specific antigen [PSA]: Secondary | ICD-10-CM | POA: Diagnosis not present

## 2022-10-29 DIAGNOSIS — R3914 Feeling of incomplete bladder emptying: Secondary | ICD-10-CM | POA: Diagnosis not present

## 2022-10-29 DIAGNOSIS — N400 Enlarged prostate without lower urinary tract symptoms: Secondary | ICD-10-CM | POA: Diagnosis not present

## 2022-10-29 DIAGNOSIS — R350 Frequency of micturition: Secondary | ICD-10-CM | POA: Diagnosis not present

## 2022-12-04 ENCOUNTER — Ambulatory Visit (INDEPENDENT_AMBULATORY_CARE_PROVIDER_SITE_OTHER): Payer: Federal, State, Local not specified - PPO

## 2022-12-04 ENCOUNTER — Encounter: Payer: Self-pay | Admitting: Family Medicine

## 2022-12-04 ENCOUNTER — Ambulatory Visit: Payer: Federal, State, Local not specified - PPO | Admitting: Family Medicine

## 2022-12-04 DIAGNOSIS — M7989 Other specified soft tissue disorders: Secondary | ICD-10-CM | POA: Insufficient documentation

## 2022-12-04 NOTE — Progress Notes (Signed)
Benjamin Hall - 62 y.o. male MRN [id number]  Date of birth: 01/05/1961  Subjective No chief complaint on file.   HPI Benjamin Hall is a 62 y.o. male here today with complaint of LLE swelling.  He noticed a few days ago.  He has not really had pain.  He does have varicose veins.  He has not had any fever or chills. Denies shortness of breath or chest pain.   ROS:  A comprehensive ROS was completed and negative except as noted per HPI  Allergies  Allergen Reactions   Penicillins Nausea And Vomiting    Patient states that as a child penicillin gave him an upset stomach    Past Medical History:  Diagnosis Date   Hyperlipidemia    Inguinal hernia, bilateral    Mixed hyperlipidemia 12/29/2018   Onychomycosis 12/29/2018   Umbilical hernia    Wears glasses     Past Surgical History:  Procedure Laterality Date   COLONOSCOPY  08/2016   COLONOSCOPY  12/29/2021   HERNIA REPAIR  1964   per pt unsure where located   INGUINAL HERNIA REPAIR Bilateral 09/21/2019   Procedure: LAPAROSCOPIC BILATERAL INGUINAL HERNIA REPAIR WITH MESH;  Surgeon: Karie Soda, MD;  Location: [facility];  Service: General;  Laterality: Bilateral;   UMBILICAL HERNIA REPAIR N/A 09/21/2019   Procedure: PRIMARY UMBILICAL HERNIA REPAIR;  Surgeon: Karie Soda, MD;  Location: [facility];  Service: General;  Laterality: N/A;   WISDOM TOOTH EXTRACTION      Social History   Socioeconomic History   Marital status: Married    Spouse name: Not on file   Number of children: 0   Years of education: Not on file   Highest education level: Not on file  Occupational History   Occupation: [employer]  Tobacco Use   Smoking status: Never   Smokeless tobacco: Never  Vaping Use   Vaping Use: Never used  Substance and Sexual Activity   Alcohol use: Yes    Alcohol/week: 7.0 standard drinks of alcohol    Types: 7 Standard drinks or equivalent per week    Comment: one daily   Drug use: Never    Sexual activity: Not on file  Other Topics Concern   Not on file  Social History Narrative   Not on file   Social Determinants of Health   Financial Resource Strain: Not on file  Food Insecurity: Not on file  Transportation Needs: Not on file  Physical Activity: Not on file  Stress: Not on file  Social Connections: Not on file    Family History  Problem Relation Age of Onset   Lung cancer Mother    Heart disease Mother    Heart attack Father    Heart disease Father    Colon cancer Neg Hx    Stomach cancer Neg Hx    Esophageal cancer Neg Hx    Colon polyps Neg Hx     Health Maintenance  Topic Date Due   DTaP/Tdap/Td (2 - Td or Tdap) 07/27/2022   Hepatitis C Screening  10/12/2023 (Originally 10/02/1979)   HIV Screening  10/12/2023 (Originally 10/01/1976)   COVID-19 Vaccine (3 - 2023-24 season) 10/28/2023 (Originally 03/27/2022)   INFLUENZA VACCINE  02/25/2023   COLONOSCOPY (Pts 45-23yrs Insurance coverage will need to be confirmed)  12/30/2026   Zoster Vaccines- Shingrix  Completed   HPV VACCINES  Aged Out     ----------------------------------------------------------------------------------------------------------------------------------------------------------------------------------------------------------------- Physical Exam There were no vitals taken for this visit.  Physical Exam Constitutional:      Appearance: Normal appearance.  HENT:     Head: Normocephalic and atraumatic.  Eyes:     General: No scleral icterus. Cardiovascular:     Rate and Rhythm: Normal rate and regular rhythm.  Pulmonary:     Effort: Pulmonary effort is normal.     Breath sounds: Normal breath sounds.  Musculoskeletal:     Comments: LLE swelling noted compared to R.    Neurological:     Mental Status: He is alert.  Psychiatric:        Mood and Affect: Mood normal.        Behavior: Behavior normal.      ------------------------------------------------------------------------------------------------------------------------------------------------------------------------------------------------------------------- Assessment and Plan  Swelling of left lower extremity Concern for DVT.  Stat venous doppler or the LLE ordered today.  He has an appt at 1pm this afternoon.   Addendum:  [place] negative for DVT.  Checking CMP, TSH, BNP.  May need imaging of the abdomen and pelvis if unable to find any other cause for unilateral swelling.     No orders of the defined types were placed in this encounter.   No follow-ups on file.    This visit occurred during the SARS-CoV-2 public health emergency.  Safety protocols were in place, including screening questions prior to the visit, additional usage of staff PPE, and extensive cleaning of exam room while observing appropriate contact time as indicated for disinfecting solutions.

## 2022-12-04 NOTE — Assessment & Plan Note (Addendum)
Concern for DVT.  Stat venous doppler or the LLE ordered today.  He has an appt at 1pm this afternoon.   Addendum:  [place] negative for DVT.  Checking CMP, TSH, BNP.  May need imaging of the abdomen and pelvis if unable to find any other cause for unilateral swelling.

## 2022-12-07 DIAGNOSIS — M7989 Other specified soft tissue disorders: Secondary | ICD-10-CM | POA: Diagnosis not present

## 2022-12-08 LAB — COMPLETE METABOLIC PANEL WITH GFR
AG Ratio: 1.9 (calc) (ref 1.0–2.5)
ALT: 21 U/L (ref 9–46)
AST: 16 U/L (ref 10–35)
Albumin: 4.2 g/dL (ref 3.6–5.1)
Alkaline phosphatase (APISO): 43 U/L (ref 35–144)
BUN: 17 mg/dL (ref 7–25)
CO2: 29 mmol/L (ref 20–32)
Calcium: 9.6 mg/dL (ref 8.6–10.3)
Chloride: 103 mmol/L (ref 98–110)
Creat: 0.86 mg/dL (ref 0.70–1.35)
Globulin: 2.2 g/dL (calc) (ref 1.9–3.7)
Glucose, Bld: 90 mg/dL (ref 65–99)
Potassium: 4.5 mmol/L (ref 3.5–5.3)
Sodium: 139 mmol/L (ref 135–146)
Total Bilirubin: 0.7 mg/dL (ref 0.2–1.2)
Total Protein: 6.4 g/dL (ref 6.1–8.1)
eGFR: 99 mL/min/{1.73_m2} (ref >=60)

## 2022-12-08 LAB — TSH: TSH: 2.12 mIU/L (ref 0.40–4.50)

## 2022-12-08 LAB — BRAIN NATRIURETIC PEPTIDE: Brain Natriuretic Peptide: 18 pg/mL (ref <100)

## 2022-12-10 DIAGNOSIS — R351 Nocturia: Secondary | ICD-10-CM | POA: Diagnosis not present

## 2022-12-10 DIAGNOSIS — R3914 Feeling of incomplete bladder emptying: Secondary | ICD-10-CM | POA: Diagnosis not present

## 2022-12-16 ENCOUNTER — Encounter: Payer: Self-pay | Admitting: Family Medicine

## 2023-01-06 ENCOUNTER — Other Ambulatory Visit: Payer: Self-pay | Admitting: Family Medicine

## 2023-01-20 DIAGNOSIS — R972 Elevated prostate specific antigen [PSA]: Secondary | ICD-10-CM | POA: Diagnosis not present

## 2023-01-20 DIAGNOSIS — R3914 Feeling of incomplete bladder emptying: Secondary | ICD-10-CM | POA: Diagnosis not present

## 2023-01-20 DIAGNOSIS — R351 Nocturia: Secondary | ICD-10-CM | POA: Diagnosis not present

## 2023-01-20 DIAGNOSIS — N401 Enlarged prostate with lower urinary tract symptoms: Secondary | ICD-10-CM | POA: Diagnosis not present

## 2023-02-24 ENCOUNTER — Telehealth: Payer: Self-pay | Admitting: Family Medicine

## 2023-02-24 DIAGNOSIS — Z0000 Encounter for general adult medical examination without abnormal findings: Secondary | ICD-10-CM

## 2023-02-24 DIAGNOSIS — R972 Elevated prostate specific antigen [PSA]: Secondary | ICD-10-CM

## 2023-02-24 DIAGNOSIS — E782 Mixed hyperlipidemia: Secondary | ICD-10-CM

## 2023-02-24 NOTE — Telephone Encounter (Signed)
Patient request labs prior to CPE. Please order. 

## 2023-02-26 NOTE — Telephone Encounter (Signed)
Lab orders entered.   CM

## 2023-03-12 DIAGNOSIS — R972 Elevated prostate specific antigen [PSA]: Secondary | ICD-10-CM | POA: Diagnosis not present

## 2023-03-12 DIAGNOSIS — Z0000 Encounter for general adult medical examination without abnormal findings: Secondary | ICD-10-CM | POA: Diagnosis not present

## 2023-03-12 DIAGNOSIS — E782 Mixed hyperlipidemia: Secondary | ICD-10-CM | POA: Diagnosis not present

## 2023-03-13 LAB — CBC WITH DIFFERENTIAL/PLATELET: Basophils Absolute: 0.1 10*3/uL (ref 0.0–0.2)

## 2023-03-13 LAB — TSH: TSH: 2.46 u[IU]/mL (ref 0.450–4.500)

## 2023-03-13 LAB — CMP14+EGFR: Creatinine, Ser: 1.04 mg/dL (ref 0.76–1.27)

## 2023-03-16 ENCOUNTER — Encounter: Payer: Self-pay | Admitting: Family Medicine

## 2023-03-16 ENCOUNTER — Ambulatory Visit (INDEPENDENT_AMBULATORY_CARE_PROVIDER_SITE_OTHER): Payer: Federal, State, Local not specified - PPO | Admitting: Family Medicine

## 2023-03-16 VITALS — BP 102/64 | HR 73 | Ht 68.0 in | Wt 176.5 lb

## 2023-03-16 DIAGNOSIS — D485 Neoplasm of uncertain behavior of skin: Secondary | ICD-10-CM | POA: Diagnosis not present

## 2023-03-16 DIAGNOSIS — Z0000 Encounter for general adult medical examination without abnormal findings: Secondary | ICD-10-CM

## 2023-03-16 NOTE — Progress Notes (Signed)
Benjamin Hall - 62 y.o. male MRN [id number]  Date of birth: 06/12/1961  Subjective Chief Complaint  Patient presents with   Annual Exam    HPI Benjamin Hall is a 62 y.o. male here today for annual exam.   He reports that he is doing pretty well.    He is moderately active and feels that diet is pretty good.   He had labs completed prior to his visit which we reviewed in detail.  His PSA level was elevated at 4.1.  Elevated previously as well, this year is lower than last year.  He does see urology.  All other labs look ok.   He has a few skin lesion that he would like to see dermatology for.  He requests [facility].   He is a non-smoker.  Has one alcoholic beverage each day.   Review of Systems  Constitutional:  Negative for chills, fever, malaise/fatigue and weight loss.  HENT:  Negative for congestion, ear pain and sore throat.   Eyes:  Negative for blurred vision, double vision and pain.  Respiratory:  Negative for cough and shortness of breath.   Cardiovascular:  Negative for chest pain and palpitations.  Gastrointestinal:  Negative for abdominal pain, blood in stool, constipation, heartburn and nausea.  Genitourinary:  Negative for dysuria and urgency.  Musculoskeletal:  Negative for joint pain and myalgias.  Neurological:  Negative for dizziness and headaches.  Endo/Heme/Allergies:  Does not bruise/bleed easily.  Psychiatric/Behavioral:  Negative for depression. The patient is not nervous/anxious and does not have insomnia.       Allergies  Allergen Reactions   Penicillins Nausea And Vomiting    Patient states that as a child penicillin gave him an upset stomach    Past Medical History:  Diagnosis Date   Hyperlipidemia    Inguinal hernia, bilateral    Mixed hyperlipidemia 12/29/2018   Onychomycosis 12/29/2018   Umbilical hernia    Wears glasses     Past Surgical History:  Procedure Laterality Date   COLONOSCOPY  08/2016   COLONOSCOPY  12/29/2021    HERNIA REPAIR  1964   per pt unsure where located   INGUINAL HERNIA REPAIR Bilateral 09/21/2019   Procedure: LAPAROSCOPIC BILATERAL INGUINAL HERNIA REPAIR WITH MESH;  Surgeon: Karie Soda, MD;  Location: [facility];  Service: General;  Laterality: Bilateral;   UMBILICAL HERNIA REPAIR N/A 09/21/2019   Procedure: PRIMARY UMBILICAL HERNIA REPAIR;  Surgeon: Karie Soda, MD;  Location: [facility];  Service: General;  Laterality: N/A;   WISDOM TOOTH EXTRACTION      Social History   Socioeconomic History   Marital status: Married    Spouse name: Not on file   Number of children: 0   Years of education: Not on file   Highest education level: Not on file  Occupational History   Occupation: [employer]  Tobacco Use   Smoking status: Never   Smokeless tobacco: Never  Vaping Use   Vaping status: Never Used  Substance and Sexual Activity   Alcohol use: Yes    Alcohol/week: 7.0 standard drinks of alcohol    Types: 7 Standard drinks or equivalent per week    Comment: one daily   Drug use: Never   Sexual activity: Not on file  Other Topics Concern   Not on file  Social History Narrative   Not on file   Social Determinants of Health   Financial Resource Strain: Not on file  Food Insecurity: Not  on file  Transportation Needs: Not on file  Physical Activity: Not on file  Stress: Not on file  Social Connections: Not on file    Family History  Problem Relation Age of Onset   Lung cancer Mother    Heart disease Mother    Heart attack Father    Heart disease Father    Colon cancer Neg Hx    Stomach cancer Neg Hx    Esophageal cancer Neg Hx    Colon polyps Neg Hx     Health Maintenance  Topic Date Due   DTaP/Tdap/Td (2 - Td or Tdap) 07/27/2022   Hepatitis C Screening  10/12/2023 (Originally 10/02/1979)   HIV Screening  10/12/2023 (Originally 10/01/1976)   INFLUENZA VACCINE  10/25/2023 (Originally 02/25/2023)   COVID-19 Vaccine (3 -  2023-24 season) 10/28/2023 (Originally 03/27/2022)   Colonoscopy  12/30/2026   Zoster Vaccines- Shingrix  Completed   HPV VACCINES  Aged Out     ----------------------------------------------------------------------------------------------------------------------------------------------------------------------------------------------------------------- Physical Exam BP 102/64   Pulse 73   Ht 5\' 8"  (1.727 m)   Wt 176 lb 8 oz (80.1 kg)   SpO2 98%   BMI 26.84 kg/m   Physical Exam Constitutional:      General: He is not in acute distress. HENT:     Head: Normocephalic and atraumatic.     Right Ear: Tympanic membrane and external ear normal.     Left Ear: Tympanic membrane and external ear normal.  Eyes:     General: No scleral icterus. Neck:     Thyroid: No thyromegaly.  Cardiovascular:     Rate and Rhythm: Normal rate and regular rhythm.     Heart sounds: Normal heart sounds.  Pulmonary:     Effort: Pulmonary effort is normal.     Breath sounds: Normal breath sounds.  Abdominal:     General: Bowel sounds are normal. There is no distension.     Palpations: Abdomen is soft.     Tenderness: There is no abdominal tenderness. There is no guarding.  Musculoskeletal:     Cervical back: Normal range of motion.  Lymphadenopathy:     Cervical: No cervical adenopathy.  Skin:    General: Skin is warm and dry.     Findings: No rash.  Neurological:     Mental Status: He is alert and oriented to person, place, and time.     Cranial Nerves: No cranial nerve deficit.     Motor: No abnormal muscle tone.  Psychiatric:        Mood and Affect: Mood normal.        Behavior: Behavior normal.     ------------------------------------------------------------------------------------------------------------------------------------------------------------------------------------------------------------------- Assessment and Plan  Well adult exam Well adult.  Recent lab results  reviewed Screenings: UTD Immunizations:  Tdap declined.  Anticipatory guidance/Risk factor reduction:  Recommendations per AVS.    No orders of the defined types were placed in this encounter.   No follow-ups on file.    This visit occurred during the SARS-CoV-2 public health emergency.  Safety protocols were in place, including screening questions prior to the visit, additional usage of staff PPE, and extensive cleaning of exam room while observing appropriate contact time as indicated for disinfecting solutions.

## 2023-03-16 NOTE — Assessment & Plan Note (Signed)
Well adult.  Recent lab results reviewed Screenings: UTD Immunizations:  Tdap declined.  Anticipatory guidance/Risk factor reduction:  Recommendations per AVS.

## 2023-03-16 NOTE — Patient Instructions (Signed)

## 2023-03-31 DIAGNOSIS — L814 Other melanin hyperpigmentation: Secondary | ICD-10-CM | POA: Diagnosis not present

## 2023-03-31 DIAGNOSIS — D225 Melanocytic nevi of trunk: Secondary | ICD-10-CM | POA: Diagnosis not present

## 2023-03-31 DIAGNOSIS — L815 Leukoderma, not elsewhere classified: Secondary | ICD-10-CM | POA: Diagnosis not present

## 2023-03-31 DIAGNOSIS — L821 Other seborrheic keratosis: Secondary | ICD-10-CM | POA: Diagnosis not present

## 2023-03-31 DIAGNOSIS — L570 Actinic keratosis: Secondary | ICD-10-CM | POA: Diagnosis not present

## 2023-04-09 ENCOUNTER — Other Ambulatory Visit: Payer: Self-pay | Admitting: Family Medicine

## 2023-05-03 DIAGNOSIS — N401 Enlarged prostate with lower urinary tract symptoms: Secondary | ICD-10-CM | POA: Diagnosis not present

## 2023-05-03 DIAGNOSIS — R350 Frequency of micturition: Secondary | ICD-10-CM | POA: Diagnosis not present

## 2023-05-12 DIAGNOSIS — N401 Enlarged prostate with lower urinary tract symptoms: Secondary | ICD-10-CM | POA: Diagnosis not present

## 2023-05-12 DIAGNOSIS — R972 Elevated prostate specific antigen [PSA]: Secondary | ICD-10-CM | POA: Diagnosis not present

## 2023-05-12 DIAGNOSIS — R350 Frequency of micturition: Secondary | ICD-10-CM | POA: Diagnosis not present

## 2023-05-13 ENCOUNTER — Other Ambulatory Visit: Payer: Self-pay | Admitting: Urology

## 2023-05-13 DIAGNOSIS — R972 Elevated prostate specific antigen [PSA]: Secondary | ICD-10-CM

## 2023-06-30 ENCOUNTER — Ambulatory Visit
Admission: RE | Admit: 2023-06-30 | Discharge: 2023-06-30 | Disposition: A | Discharge status: Home / Self Care | Payer: Federal, State, Local not specified - PPO | Source: Ambulatory Visit | Attending: Urology | Admitting: Urology

## 2023-06-30 DIAGNOSIS — R972 Elevated prostate specific antigen [PSA]: Secondary | ICD-10-CM | POA: Diagnosis not present

## 2023-06-30 MED ORDER — GADOPICLENOL 0.5 MMOL/ML IV SOLN
8.0000 mL | Freq: Once | INTRAVENOUS | Status: AC | PRN
  Administered 2023-06-30: 8 mL via INTRAVENOUS

## 2023-09-15 IMAGING — CT CT ABD-PELV W/ CM
2 of 5 series · 15 of 46 positions shown, 17 images · IV contrast (APPLIED)
Comparison: None.

CLINICAL DATA: Lower abdominal pain and discomfort x2 months prior
bilateral inguinal hernia repairs and umbilical hernia repair.
Concern for recurrence.

EXAM:
CT ABDOMEN AND PELVIS WITH CONTRAST
TECHNIQUE: Multidetector CT imaging of the abdomen and pelvis was performed
using the standard protocol following bolus administration of
intravenous contrast.

[Series 2: axial st · axial · 0.69mm/px · z∈[+704,+1134]mm · 12 of 98 slices shown, 14 images]
[im 6/98  soft-tissue]
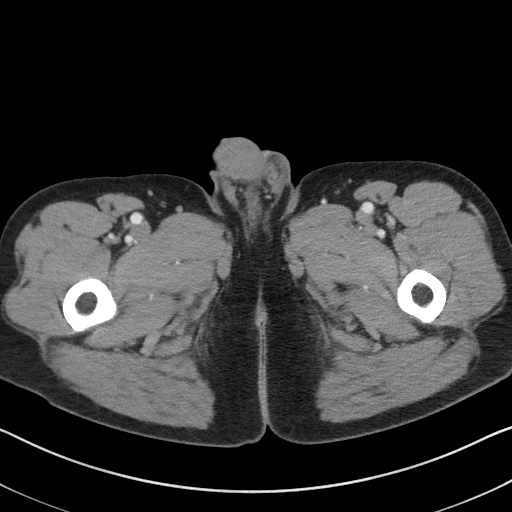
[im 6/98  bone]
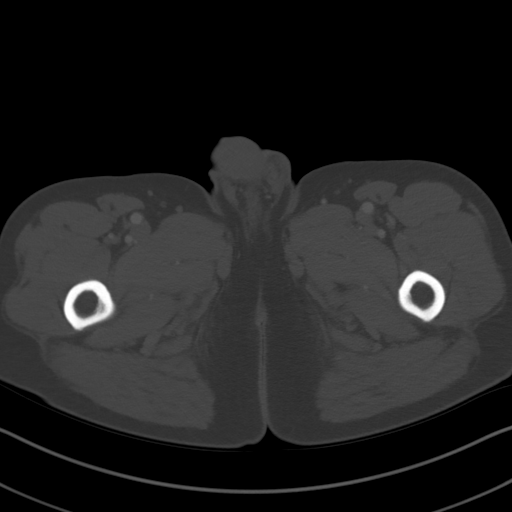
[im 17/98  soft-tissue]
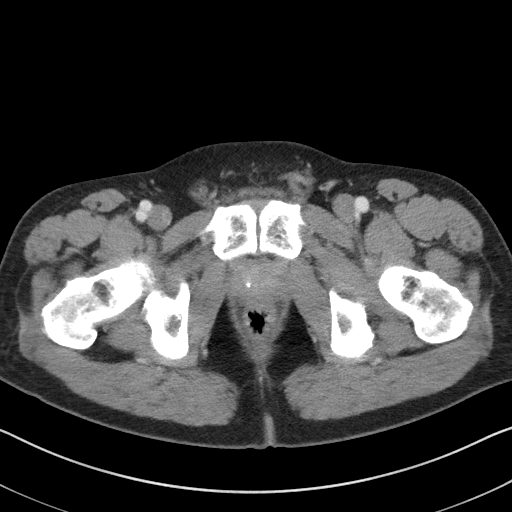
[im 22/98  soft-tissue]
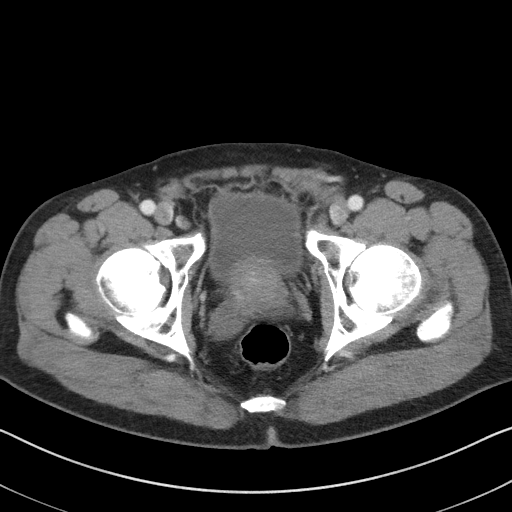
[im 27/98  soft-tissue]
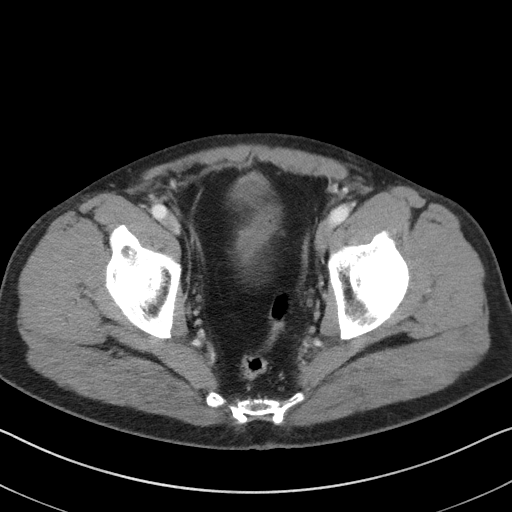
[im 38/98  soft-tissue]
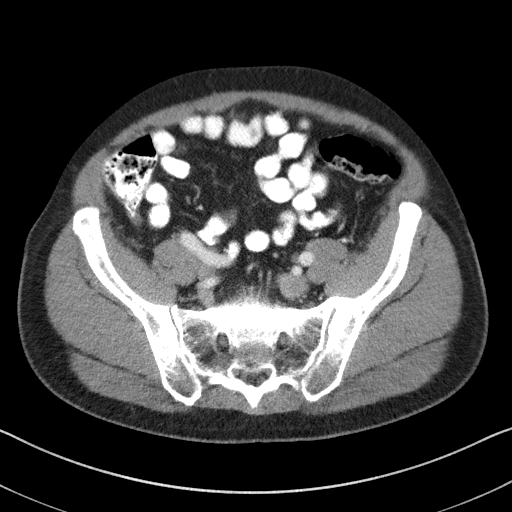
[im 44/98  soft-tissue]
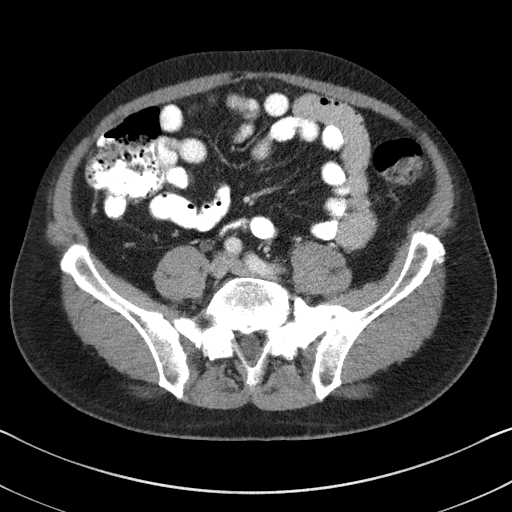
[im 54/98  soft-tissue]
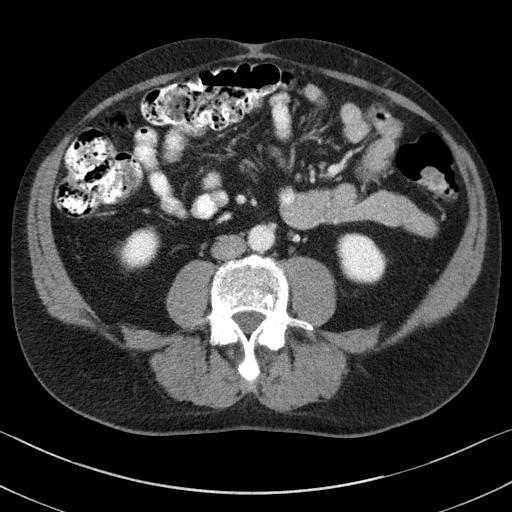
[im 60/98  soft-tissue]
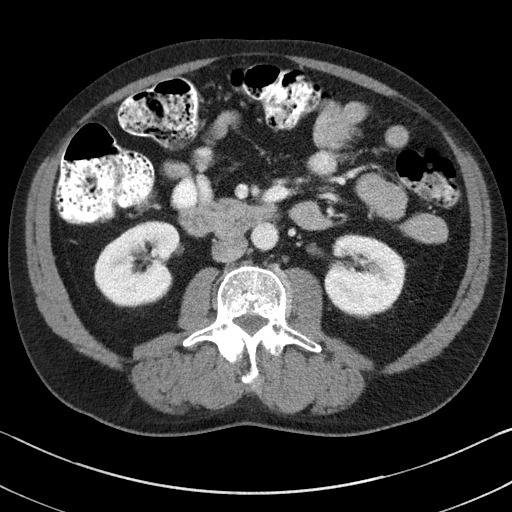
[im 71/98  soft-tissue]
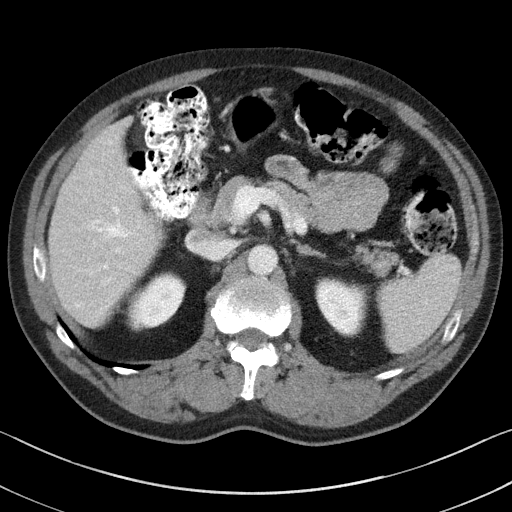
[im 71/98  bone]
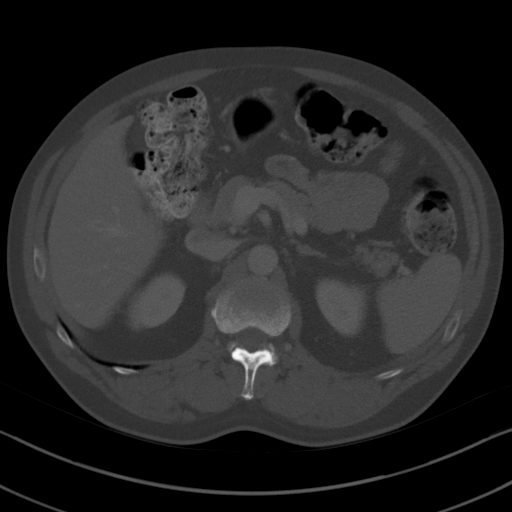
[im 76/98  soft-tissue]
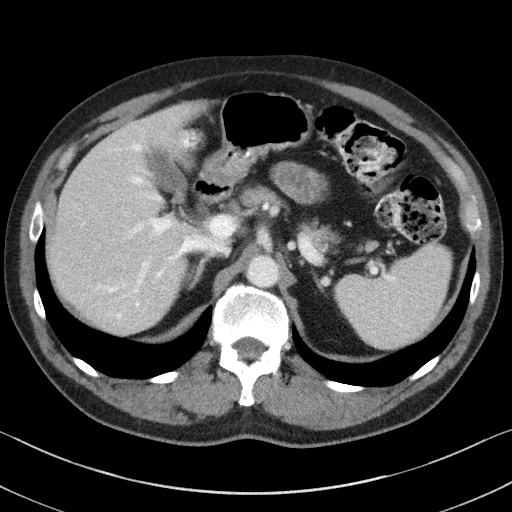
[im 81/98  soft-tissue]
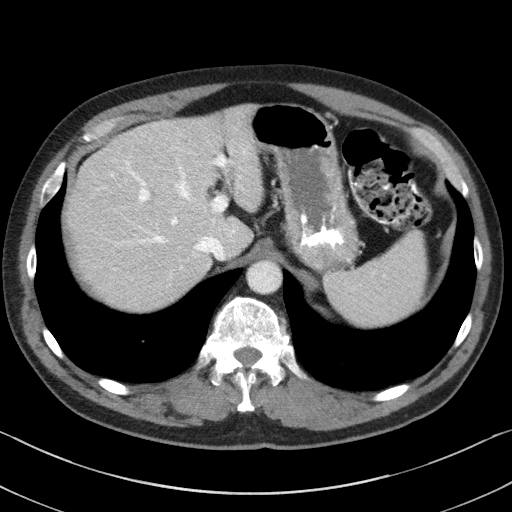
[im 92/98  soft-tissue]
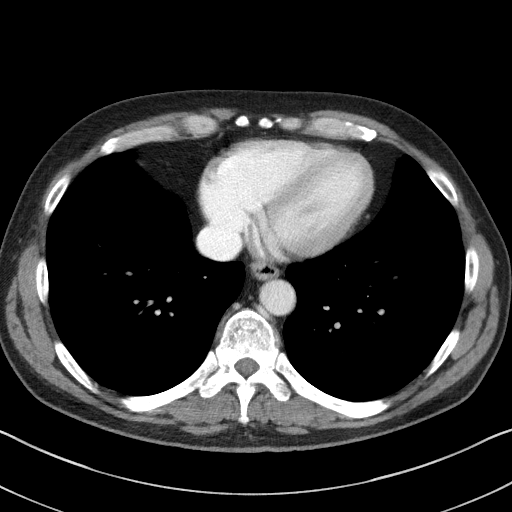

[Series 5: coronal st · coronal · 0.74mm/px · 3 of 88 slices shown]
[im 30/88  soft-tissue]
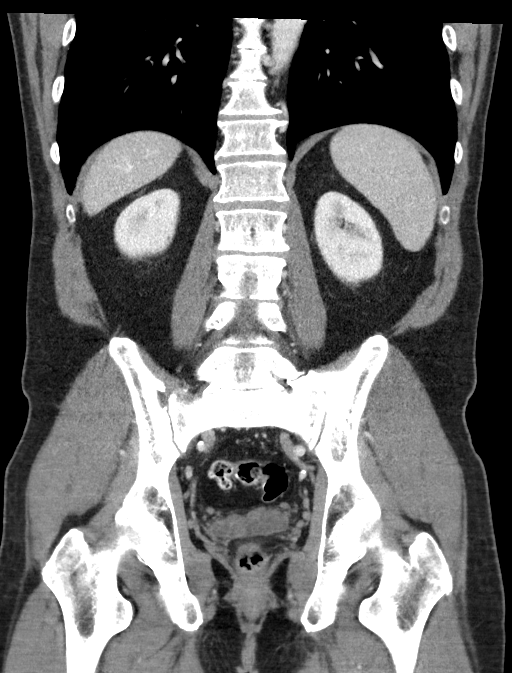
[im 39/88  soft-tissue]
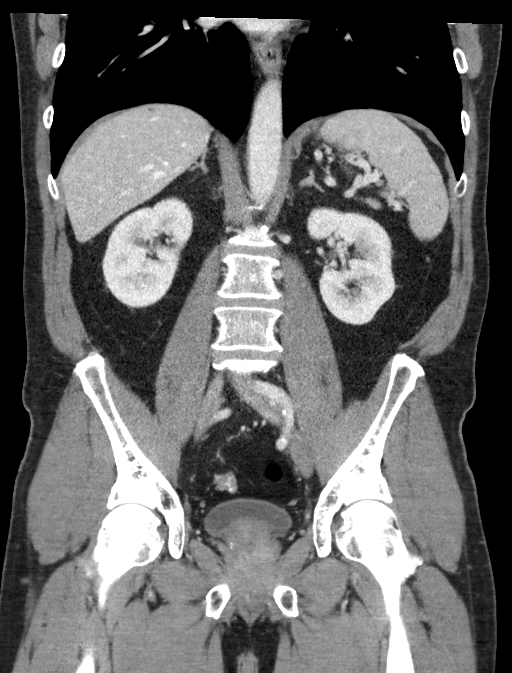
[im 49/88  soft-tissue]
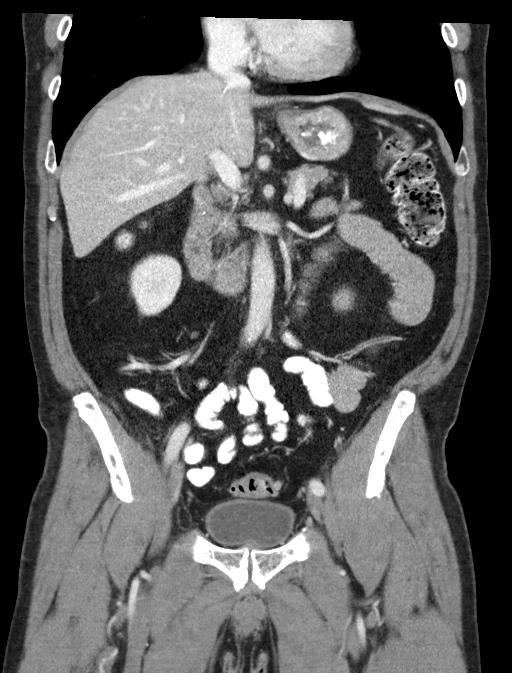

[15 of 46 positions shown; findings below may reference images not displayed]

RADIATION DOSE REDUCTION: This exam was performed according to the
departmental dose-optimization program which includes automated
exposure control, adjustment of the mA and/or kV according to
patient size and/or use of iterative reconstruction technique.

CONTRAST:  100mL OMNIPAQUE IOHEXOL 300 MG/ML  SOLN
FINDINGS: Lower chest: No acute abnormality.

Hepatobiliary: Hypodense hepatic lesion measuring 15 mm on image
[DATE] measures fluid density consistent with a hepatic cyst.
Gallbladder is unremarkable. No biliary ductal dilation.

Pancreas: No pancreatic ductal dilation or evidence of acute
inflammation.

Spleen: No splenomegaly or focal splenic lesion.

Adrenals/Urinary Tract: Bilateral adrenal glands appear normal. No
hydronephrosis. Kidneys demonstrate symmetric enhancement and
excretion of contrast. Urinary bladder is effaced by an enlarged
prostate gland, otherwise unremarkable for degree of distension.

Stomach/Bowel: Radiopaque enteric contrast material traverses the
splenic flexure. Small hiatal hernia otherwise the stomach is
unremarkable for degree of distension. No pathologic dilation of
small large bowel. The appendix and terminal ileum within normal
limits. Moderate volume of formed stool throughout colon suggestive
of constipation. Sigmoid colonic diverticulosis with mild short
segment sigmoid colonic wall thickening in a region of
diverticulosis.

Vascular/Lymphatic: Normal caliber abdominal aorta. No
pathologically enlarged abdominopelvic lymph nodes.

Reproductive: Heterogeneous enhancement of an enlarged prostate
gland which measures 5.2 x 4.6 x 3.9 cm (volume = 49 cm^3)

Other: Prior bilateral inguinal hernia and umbilical hernia repairs
without recurrence.

Musculoskeletal: Multilevel degenerative changes spine. Degenerative
change of the SI joints with partial bony ankylosis of the left SI
joint. Hemi-sacralization of the left L5 vertebral body. Chronic
changes at the pubic symphysis.
IMPRESSION: 1. Prior bilateral inguinal hernia and umbilical hernia repairs
without recurrence.
2. Sigmoid colonic diverticulosis with mild short segment sigmoid
colonic wall thickening in a region of diverticulosis, which likely
reflects a combination of underdistention and chronic inflammation.
However underlying neoplasm not excluded consider correlation with
findings at colonoscopy.
3. Nonspecific heterogeneous enhancement of a mildly enlarged
prostate with a volume of 49 cc.
4. Moderate volume of formed stool throughout colon suggestive of
constipation.

## 2023-11-01 DIAGNOSIS — R3914 Feeling of incomplete bladder emptying: Secondary | ICD-10-CM | POA: Diagnosis not present

## 2023-11-01 DIAGNOSIS — N401 Enlarged prostate with lower urinary tract symptoms: Secondary | ICD-10-CM | POA: Diagnosis not present

## 2023-11-02 DIAGNOSIS — R972 Elevated prostate specific antigen [PSA]: Secondary | ICD-10-CM | POA: Diagnosis not present

## 2023-11-02 DIAGNOSIS — R3914 Feeling of incomplete bladder emptying: Secondary | ICD-10-CM | POA: Diagnosis not present

## 2023-11-02 DIAGNOSIS — N401 Enlarged prostate with lower urinary tract symptoms: Secondary | ICD-10-CM | POA: Diagnosis not present

## 2023-12-23 ENCOUNTER — Other Ambulatory Visit: Payer: Self-pay | Admitting: Family Medicine

## 2024-03-06 ENCOUNTER — Encounter: Payer: Self-pay | Admitting: Family Medicine

## 2024-03-06 DIAGNOSIS — E782 Mixed hyperlipidemia: Secondary | ICD-10-CM

## 2024-03-06 DIAGNOSIS — Z0000 Encounter for general adult medical examination without abnormal findings: Secondary | ICD-10-CM

## 2024-03-06 DIAGNOSIS — R972 Elevated prostate specific antigen [PSA]: Secondary | ICD-10-CM

## 2024-03-08 DIAGNOSIS — Z0000 Encounter for general adult medical examination without abnormal findings: Secondary | ICD-10-CM | POA: Diagnosis not present

## 2024-03-08 DIAGNOSIS — E782 Mixed hyperlipidemia: Secondary | ICD-10-CM | POA: Diagnosis not present

## 2024-03-08 DIAGNOSIS — R972 Elevated prostate specific antigen [PSA]: Secondary | ICD-10-CM | POA: Diagnosis not present

## 2024-03-09 DIAGNOSIS — L820 Inflamed seborrheic keratosis: Secondary | ICD-10-CM | POA: Diagnosis not present

## 2024-03-09 DIAGNOSIS — W908XXS Exposure to other nonionizing radiation, sequela: Secondary | ICD-10-CM | POA: Diagnosis not present

## 2024-03-09 DIAGNOSIS — L578 Other skin changes due to chronic exposure to nonionizing radiation: Secondary | ICD-10-CM | POA: Diagnosis not present

## 2024-03-09 DIAGNOSIS — L814 Other melanin hyperpigmentation: Secondary | ICD-10-CM | POA: Diagnosis not present

## 2024-03-09 DIAGNOSIS — L400 Psoriasis vulgaris: Secondary | ICD-10-CM | POA: Diagnosis not present

## 2024-03-09 LAB — CMP14+EGFR
ALT: 22 IU/L (ref 0–44)
AST: 15 IU/L (ref 0–40)
Albumin: 4.3 g/dL (ref 3.9–4.9)
Alkaline Phosphatase: 52 IU/L (ref 44–121)
BUN/Creatinine Ratio: 20 (ref 10–24)
BUN: 20 mg/dL (ref 8–27)
Bilirubin Total: 0.7 mg/dL (ref 0.0–1.2)
CO2: 21 mmol/L (ref 20–29)
Calcium: 9 mg/dL (ref 8.6–10.2)
Chloride: 101 mmol/L (ref 96–106)
Creatinine, Ser: 1 mg/dL (ref 0.76–1.27)
Globulin, Total: 2.2 g/dL (ref 1.5–4.5)
Glucose: 97 mg/dL (ref 70–99)
Potassium: 4.2 mmol/L (ref 3.5–5.2)
Sodium: 140 mmol/L (ref 134–144)
Total Protein: 6.5 g/dL (ref 6.0–8.5)
eGFR: 85 mL/min/1.73 (ref >59)

## 2024-03-09 LAB — CBC WITH DIFFERENTIAL/PLATELET
Basophils Absolute: 0 x10E3/uL (ref 0.0–0.2)
Basos: 1 %
EOS (ABSOLUTE): 0.2 x10E3/uL (ref 0.0–0.4)
Eos: 3 %
Hematocrit: 46.8 % (ref 37.5–51.0)
Hemoglobin: 15.8 g/dL (ref 13.0–17.7)
Immature Grans (Abs): 0 x10E3/uL (ref 0.0–0.1)
Immature Granulocytes: 0 %
Lymphocytes Absolute: 1.3 x10E3/uL (ref 0.7–3.1)
Lymphs: 26 %
MCH: 31.5 pg (ref 26.6–33.0)
MCHC: 33.8 g/dL (ref 31.5–35.7)
MCV: 93 fL (ref 79–97)
Monocytes Absolute: 0.4 x10E3/uL (ref 0.1–0.9)
Monocytes: 9 %
Neutrophils Absolute: 3.2 x10E3/uL (ref 1.4–7.0)
Neutrophils: 61 %
Platelets: 171 x10E3/uL (ref 150–450)
RBC: 5.02 x10E6/uL (ref 4.14–5.80)
RDW: 13.4 % (ref 11.6–15.4)
WBC: 5.2 x10E3/uL (ref 3.4–10.8)

## 2024-03-09 LAB — PSA: Prostate Specific Ag, Serum: 4.8 ng/mL — ABNORMAL HIGH (ref 0.0–4.0)

## 2024-03-09 LAB — LIPID PANEL WITH LDL/HDL RATIO
Cholesterol, Total: 173 mg/dL (ref 100–199)
HDL: 57 mg/dL (ref >39)
LDL Chol Calc (NIH): 99 mg/dL (ref 0–99)
LDL/HDL Ratio: 1.7 ratio (ref 0.0–3.6)
Triglycerides: 96 mg/dL (ref 0–149)
VLDL Cholesterol Cal: 17 mg/dL (ref 5–40)

## 2024-03-16 ENCOUNTER — Encounter: Payer: Self-pay | Admitting: Family Medicine

## 2024-03-16 ENCOUNTER — Ambulatory Visit (INDEPENDENT_AMBULATORY_CARE_PROVIDER_SITE_OTHER): Payer: Federal, State, Local not specified - PPO | Admitting: Family Medicine

## 2024-03-16 VITALS — BP 95/55 | HR 75 | Ht 67.62 in | Wt 178.4 lb

## 2024-03-16 DIAGNOSIS — Z0000 Encounter for general adult medical examination without abnormal findings: Secondary | ICD-10-CM | POA: Diagnosis not present

## 2024-03-16 DIAGNOSIS — B351 Tinea unguium: Secondary | ICD-10-CM | POA: Diagnosis not present

## 2024-03-16 DIAGNOSIS — Z13220 Encounter for screening for lipoid disorders: Secondary | ICD-10-CM | POA: Diagnosis not present

## 2024-03-16 DIAGNOSIS — R972 Elevated prostate specific antigen [PSA]: Secondary | ICD-10-CM

## 2024-03-16 MED ORDER — SIMVASTATIN 20 MG PO TABS: 20.0000 mg | ORAL_TABLET | Freq: Every day | ORAL | 3 refills | Status: AC

## 2024-03-16 NOTE — Progress Notes (Signed)
 Benjamin Hall - 63 y.o. male MRN [id number]  Date of birth: 1961/03/08  Subjective Chief Complaint  Patient presents with   Annual Exam    HPI Benjamin Hall is a 63 y.o. male here today for annual exam.   He reports that he is doing well.  Requesting referral to podiatry for toenail fungus.     Recent labs reviewed with him.   He continues to see urology for BPH.  Remains on flomax.    He stays pretty active.  He feels that diet is pretty good.   He is a non-smoker. Occasional EtOH.   Review of Systems  Constitutional:  Negative for chills, fever, malaise/fatigue and weight loss.  HENT:  Negative for congestion, ear pain and sore throat.   Eyes:  Negative for blurred vision, double vision and pain.  Respiratory:  Negative for cough and shortness of breath.   Cardiovascular:  Negative for chest pain and palpitations.  Gastrointestinal:  Negative for abdominal pain, blood in stool, constipation, heartburn and nausea.  Genitourinary:  Negative for dysuria and urgency.  Musculoskeletal:  Negative for joint pain and myalgias.  Neurological:  Negative for dizziness and headaches.  Endo/Heme/Allergies:  Does not bruise/bleed easily.  Psychiatric/Behavioral:  Negative for depression. The patient is not nervous/anxious and does not have insomnia.      Allergies  Allergen Reactions   Penicillins Nausea And Vomiting    Patient states that as a child penicillin gave him an upset stomach    Past Medical History:  Diagnosis Date   Hyperlipidemia    Inguinal hernia, bilateral    Mixed hyperlipidemia 12/29/2018   Onychomycosis 12/29/2018   Umbilical hernia    Wears glasses     Past Surgical History:  Procedure Laterality Date   COLONOSCOPY  08/2016   COLONOSCOPY  12/29/2021   HERNIA REPAIR  1964   per pt unsure where located   INGUINAL HERNIA REPAIR Bilateral 09/21/2019   Procedure: LAPAROSCOPIC BILATERAL INGUINAL HERNIA REPAIR WITH MESH;  Surgeon: Sheldon Standing, MD;  Location:  [facility];  Service: General;  Laterality: Bilateral;   UMBILICAL HERNIA REPAIR N/A 09/21/2019   Procedure: PRIMARY UMBILICAL HERNIA REPAIR;  Surgeon: Sheldon Standing, MD;  Location: [facility];  Service: General;  Laterality: N/A;   WISDOM TOOTH EXTRACTION      Social History   Socioeconomic History   Marital status: Married    Spouse name: Not on file   Number of children: 0   Years of education: Not on file   Highest education level: Master's degree (e.g., MA, MS, MEng, MEd, MSW, MBA)  Occupational History   Occupation: [employer]  Tobacco Use   Smoking status: Never   Smokeless tobacco: Never  Vaping Use   Vaping status: Never Used  Substance and Sexual Activity   Alcohol use: Yes    Alcohol/week: 7.0 standard drinks of alcohol    Types: 7 Standard drinks or equivalent per week    Comment: one daily   Drug use: Never   Sexual activity: Not on file  Other Topics Concern   Not on file  Social History Narrative   Not on file   Social Drivers of Health   Financial Resource Strain: Low Risk  (03/13/2024)   Overall Financial Resource Strain (CARDIA)    Difficulty of Paying Living Expenses: Not hard at all  Food Insecurity: No Food Insecurity (03/13/2024)   Hunger Vital Sign    Worried About Running Out of Food in  the Last Year: Never true    Ran Out of Food in the Last Year: Never true  Transportation Needs: No Transportation Needs (03/13/2024)   PRAPARE - Administrator, Civil Service (Medical): No    Lack of Transportation (Non-Medical): No  Physical Activity: Sufficiently Active (03/13/2024)   Exercise Vital Sign    Days of Exercise per Week: 7 days    Minutes of Exercise per Session: 60 min  Stress: No Stress Concern Present (03/13/2024)   [employer] of Occupational Health - Occupational Stress Questionnaire    Feeling of Stress: Not at all  Social Connections: Moderately Integrated (03/13/2024)   Social  Connection and Isolation Panel    Frequency of Communication with Friends and Family: More than three times a week    Frequency of Social Gatherings with Friends and Family: Once a week    Attends Religious Services: 1 to 4 times per year    Active Member of [employer] or Organizations: No    Attends Engineer, structural: Not on file    Marital Status: Married    Family History  Problem Relation Age of Onset   Lung cancer Mother    Heart disease Mother    Heart attack Father    Heart disease Father    Colon cancer Neg Hx    Stomach cancer Neg Hx    Esophageal cancer Neg Hx    Colon polyps Neg Hx     Health Maintenance  Topic Date Due   HIV Screening  Never done   Hepatitis C Screening  Never done   Pneumococcal Vaccine: 50+ Years (1 of 1 - PCV) Never done   COVID-19 Vaccine (3 - Pfizer risk series) 01/01/2020   DTaP/Tdap/Td (2 - Td or Tdap) 07/27/2022   INFLUENZA VACCINE  02/25/2024   Colonoscopy  12/30/2026   Zoster Vaccines- Shingrix  Completed   Hepatitis B Vaccines 19-59 Average Risk  Aged Out   HPV VACCINES  Aged Out   Meningococcal B Vaccine  Aged Out     ----------------------------------------------------------------------------------------------------------------------------------------------------------------------------------------------------------------- Physical Exam BP (!) 95/55 (BP Location: Left Arm, Patient Position: Sitting, Cuff Size: Normal)   Pulse 75   Ht 5' 7.62 (1.718 m)   Wt 178 lb 6.4 oz (80.9 kg)   SpO2 97%   BMI 27.43 kg/m   Physical Exam Constitutional:      General: He is not in acute distress. HENT:     Head: Normocephalic and atraumatic.     Right Ear: Tympanic membrane and external ear normal.     Left Ear: Tympanic membrane and external ear normal.  Eyes:     General: No scleral icterus. Neck:     Thyroid : No thyromegaly.  Cardiovascular:     Rate and Rhythm: Normal rate and regular rhythm.     Heart sounds: Normal  heart sounds.  Pulmonary:     Effort: Pulmonary effort is normal.     Breath sounds: Normal breath sounds.  Abdominal:     General: Bowel sounds are normal. There is no distension.     Palpations: Abdomen is soft.     Tenderness: There is no abdominal tenderness. There is no guarding.  Musculoskeletal:     Cervical back: Normal range of motion.  Lymphadenopathy:     Cervical: No cervical adenopathy.  Skin:    General: Skin is warm and dry.     Findings: No rash.     Comments: Thickened, elongated, dystrophic nails.   Neurological:  Mental Status: He is alert and oriented to person, place, and time.     Cranial Nerves: No cranial nerve deficit.     Motor: No abnormal muscle tone.  Psychiatric:        Mood and Affect: Mood normal.        Behavior: Behavior normal.     ------------------------------------------------------------------------------------------------------------------------------------------------------------------------------------------------------------------- Assessment and Plan  Well adult exam Well adult.  Recent lab results reviewed Screenings: UTD Immunizations:  Tdap declined.  Anticipatory guidance/Risk factor reduction:  Recommendations per AVS.   Onychomycosis Referral to podiatry.    Meds ordered this encounter  Medications   simvastatin  (ZOCOR ) 20 MG tablet    Sig: Take 1 tablet (20 mg total) by mouth daily.    Dispense:  90 tablet    Refill:  3    Return in about 1 year (around 03/16/2025) for Annual Exam.

## 2024-03-16 NOTE — Assessment & Plan Note (Signed)
 Referral to podiatry

## 2024-03-16 NOTE — Assessment & Plan Note (Signed)
Well adult.  Recent lab results reviewed Screenings: UTD Immunizations:  Tdap declined.  Anticipatory guidance/Risk factor reduction:  Recommendations per AVS.

## 2024-03-16 NOTE — Patient Instructions (Signed)

## 2024-03-23 ENCOUNTER — Ambulatory Visit: Admitting: Podiatry

## 2024-03-23 ENCOUNTER — Encounter: Payer: Self-pay | Admitting: Podiatry

## 2024-03-23 DIAGNOSIS — B351 Tinea unguium: Secondary | ICD-10-CM | POA: Diagnosis not present

## 2024-03-23 NOTE — Progress Notes (Signed)
  Subjective:  Patient ID: Benjamin Hall, male    DOB: 1960/08/23,   MRN: [id number]  Chief Complaint  Patient presents with   Nail Problem    I have unsightly toenails.  A couple are thick and a little bit of pain.    63 y.o. male presents for concern of nail changes that have been present for years and recently have been worsening. Denies pain. Relates he has not tired any treatments. Would like to try and avoid oral medications.  . Denies any other pedal complaints. Denies n/v/f/c.   Past Medical History:  Diagnosis Date   Hyperlipidemia    Inguinal hernia, bilateral    Mixed hyperlipidemia 12/29/2018   Onychomycosis 12/29/2018   Umbilical hernia    Wears glasses     Objective:  Physical Exam: Vascular: DP/PT pulses 2/4 bilateral. CFT <3 seconds. Normal hair growth on digits. No edema.  Skin. No lacerations or abrasions bilateral feet. Bilateral hallux second third and fifth digits are dystrophic with subungual debris.  Musculoskeletal: MMT 5/5 bilateral lower extremities in DF, PF, Inversion and Eversion. Deceased ROM in DF of ankle joint.  Neurological: Sensation intact to light touch.   Assessment:   1. Onychomycosis      Plan:  Patient was evaluated and treated and all questions answered. -Examined patient -Discussed treatment options for painful dystrophic nails  -Clinical picture and Fungal culture was obtained by removing a portion of the hard nail itself from each of the involved toenails using a sterile nail nipper and sent to [facility] lab. Patient tolerated the biopsy procedure well without discomfort or need for anesthesia.  -Discussed fungal nail treatment options including oral, topical, and laser treatments.  -Patient to return in 4 weeks for follow up evaluation and discussion of fungal culture results or sooner if symptoms worsen.   Asberry Failing, DPM

## 2024-03-23 NOTE — Addendum Note (Signed)
 Addended by: WAYLAN ELIDIA PARAS on: 03/23/2024 04:46 PM   Modules accepted: Orders

## 2024-03-28 ENCOUNTER — Encounter: Payer: Self-pay | Admitting: Sports Medicine

## 2024-03-29 ENCOUNTER — Other Ambulatory Visit: Payer: Self-pay | Admitting: Podiatry

## 2024-04-20 ENCOUNTER — Encounter: Payer: Self-pay | Admitting: Podiatry

## 2024-04-20 ENCOUNTER — Ambulatory Visit: Admitting: Podiatry

## 2024-04-20 DIAGNOSIS — B351 Tinea unguium: Secondary | ICD-10-CM

## 2024-04-20 MED ORDER — CICLOPIROX 8 % EX SOLN: Freq: Every day | CUTANEOUS | 0 refills | Status: DC

## 2024-04-20 NOTE — Progress Notes (Signed)
  Subjective:  Patient ID: Benjamin Hall, male    DOB: 10-09-1960,   MRN: [id number]  Chief Complaint  Patient presents with   Nail Problem    I'm here for a follow-up for the culture that was done on a sample of my toenail.    63 y.o. male presents for follow-up of fungus and to review culture results. .  . Denies any other pedal complaints. Denies n/v/f/c.   Past Medical History:  Diagnosis Date   Hyperlipidemia    Inguinal hernia, bilateral    Mixed hyperlipidemia 12/29/2018   Onychomycosis 12/29/2018   Umbilical hernia    Wears glasses     Objective:  Physical Exam: Vascular: DP/PT pulses 2/4 bilateral. CFT <3 seconds. Normal hair growth on digits. No edema.  Skin. No lacerations or abrasions bilateral feet. Bilateral hallux second third and fifth digits are dystrophic with subungual debris.  Musculoskeletal: MMT 5/5 bilateral lower extremities in DF, PF, Inversion and Eversion. Deceased ROM in DF of ankle joint.  Neurological: Sensation intact to light touch.   Assessment:   1. Onychomycosis       Plan:  Patient was evaluated and treated and all questions answered. -Examined patient -Discussed treatment options for painful dystrophic nails  -Culture postiive for t rubrum.  -Discussed fungal nail treatment options including oral, topical, and laser treatments.  -Penlac  sent to pharmacy and will continue vicks as well alternating.  -Patient to return in 3 months fo recheck.    Asberry Failing, DPM

## 2024-05-16 DIAGNOSIS — R972 Elevated prostate specific antigen [PSA]: Secondary | ICD-10-CM | POA: Diagnosis not present

## 2024-05-23 DIAGNOSIS — N400 Enlarged prostate without lower urinary tract symptoms: Secondary | ICD-10-CM | POA: Diagnosis not present

## 2024-05-23 DIAGNOSIS — R351 Nocturia: Secondary | ICD-10-CM | POA: Diagnosis not present

## 2024-05-23 DIAGNOSIS — R350 Frequency of micturition: Secondary | ICD-10-CM | POA: Diagnosis not present

## 2024-05-23 DIAGNOSIS — R3914 Feeling of incomplete bladder emptying: Secondary | ICD-10-CM | POA: Diagnosis not present

## 2024-08-31 ENCOUNTER — Encounter: Payer: Self-pay | Admitting: Podiatry

## 2024-08-31 ENCOUNTER — Ambulatory Visit: Admitting: Podiatry

## 2024-08-31 DIAGNOSIS — B351 Tinea unguium: Secondary | ICD-10-CM

## 2024-08-31 MED ORDER — CICLOPIROX 8 % EX SOLN: Freq: Every day | CUTANEOUS | 6 refills | Status: AC

## 2024-08-31 NOTE — Progress Notes (Signed)
"  °  Subjective:  Patient ID: Benjamin Hall, male    DOB: January 01, 1961,   MRN: [id number]  Chief Complaint  Patient presents with   Nail Problem    They're doing alright.  I been lazy.  I ran out of the medicine a couple of weeks ago.  I never got it refilled.  I'll let the doctor make that judgement.    64 y.o. male presents for follow-up of fungus . He has been using the Penlac  . Relates some improvement with time. He did run out of the medication a couple weeks ago so not using recently.  Denies any other pedal complaints. Denies n/v/f/c.   Past Medical History:  Diagnosis Date   Hyperlipidemia    Inguinal hernia, bilateral    Mixed hyperlipidemia 12/29/2018   Onychomycosis 12/29/2018   Umbilical hernia    Wears glasses     Objective:  Physical Exam: Vascular: DP/PT pulses 2/4 bilateral. CFT <3 seconds. Normal hair growth on digits. No edema.  Skin. No lacerations or abrasions bilateral feet. Bilateral hallux second third and fifth digits are dystrophic with subungual debris.  Musculoskeletal: MMT 5/5 bilateral lower extremities in DF, PF, Inversion and Eversion. Deceased ROM in DF of ankle joint.  Neurological: Sensation intact to light touch.   Assessment:   1. Onychomycosis        Plan:  Patient was evaluated and treated and all questions answered. -Examined patient -Discussed treatment options for painful dystrophic nails  -Culture postiive for t rubrum.  -Discussed fungal nail treatment options including oral, topical, and laser treatments.  -Penlac  refill sent to pharmacy and will continue vicks as well alternating.  -Patient to return in 6 months fo recheck.    Asberry Failing, DPM    "

## 2025-03-19 ENCOUNTER — Encounter: Admitting: Family Medicine
